# Patient Record
Sex: Male | Born: 1970 | Race: White | Hispanic: No | Smoking: Never smoker
Health system: Southern US, Community
[De-identification: ages and names within clinical notes are randomized; demographics above are authoritative.]

## PROBLEM LIST (undated history)

## (undated) DIAGNOSIS — I10 Essential (primary) hypertension: Secondary | ICD-10-CM

## (undated) DIAGNOSIS — K219 Gastro-esophageal reflux disease without esophagitis: Secondary | ICD-10-CM

## (undated) DIAGNOSIS — J45909 Unspecified asthma, uncomplicated: Secondary | ICD-10-CM

## (undated) DIAGNOSIS — I341 Nonrheumatic mitral (valve) prolapse: Secondary | ICD-10-CM

## (undated) DIAGNOSIS — R011 Cardiac murmur, unspecified: Secondary | ICD-10-CM

## (undated) HISTORY — DX: Cardiac murmur, unspecified: R01.1

## (undated) HISTORY — DX: Unspecified asthma, uncomplicated: J45.909

---

## 1999-10-03 HISTORY — PX: WISDOM TOOTH EXTRACTION: SHX21

## 2011-07-24 DIAGNOSIS — J309 Allergic rhinitis, unspecified: Secondary | ICD-10-CM | POA: Insufficient documentation

## 2016-03-21 DIAGNOSIS — I341 Nonrheumatic mitral (valve) prolapse: Secondary | ICD-10-CM | POA: Insufficient documentation

## 2016-06-28 DIAGNOSIS — N529 Male erectile dysfunction, unspecified: Secondary | ICD-10-CM | POA: Insufficient documentation

## 2017-03-07 DIAGNOSIS — K219 Gastro-esophageal reflux disease without esophagitis: Secondary | ICD-10-CM | POA: Insufficient documentation

## 2017-03-07 DIAGNOSIS — J4520 Mild intermittent asthma, uncomplicated: Secondary | ICD-10-CM | POA: Insufficient documentation

## 2018-04-05 DIAGNOSIS — Z800 Family history of malignant neoplasm of digestive organs: Secondary | ICD-10-CM | POA: Insufficient documentation

## 2018-10-27 ENCOUNTER — Encounter (HOSPITAL_BASED_OUTPATIENT_CLINIC_OR_DEPARTMENT_OTHER): Payer: Self-pay | Admitting: Emergency Medicine

## 2018-10-27 ENCOUNTER — Emergency Department (HOSPITAL_BASED_OUTPATIENT_CLINIC_OR_DEPARTMENT_OTHER): Payer: Managed Care, Other (non HMO)

## 2018-10-27 ENCOUNTER — Other Ambulatory Visit: Payer: Self-pay

## 2018-10-27 ENCOUNTER — Emergency Department (HOSPITAL_BASED_OUTPATIENT_CLINIC_OR_DEPARTMENT_OTHER)
Admission: EM | Admit: 2018-10-27 | Discharge: 2018-10-27 | Disposition: A | Discharge status: Home / Self Care | Payer: Managed Care, Other (non HMO) | Attending: Emergency Medicine | Admitting: Emergency Medicine

## 2018-10-27 DIAGNOSIS — Y999 Unspecified external cause status: Secondary | ICD-10-CM | POA: Insufficient documentation

## 2018-10-27 DIAGNOSIS — S6992XA Unspecified injury of left wrist, hand and finger(s), initial encounter: Secondary | ICD-10-CM | POA: Diagnosis present

## 2018-10-27 DIAGNOSIS — M25532 Pain in left wrist: Secondary | ICD-10-CM | POA: Diagnosis not present

## 2018-10-27 DIAGNOSIS — W208XXA Other cause of strike by thrown, projected or falling object, initial encounter: Secondary | ICD-10-CM | POA: Diagnosis not present

## 2018-10-27 DIAGNOSIS — Y939 Activity, unspecified: Secondary | ICD-10-CM | POA: Insufficient documentation

## 2018-10-27 DIAGNOSIS — Y9289 Other specified places as the place of occurrence of the external cause: Secondary | ICD-10-CM | POA: Insufficient documentation

## 2018-10-27 HISTORY — DX: Nonrheumatic mitral (valve) prolapse: I34.1

## 2018-10-27 HISTORY — DX: Gastro-esophageal reflux disease without esophagitis: K21.9

## 2018-10-27 MED ORDER — NAPROXEN 500 MG PO TABS: 500.0000 mg | ORAL_TABLET | Freq: Two times a day (BID) | ORAL | 0 refills | Status: DC

## 2018-10-27 NOTE — Discharge Instructions (Addendum)
Today you were evaluated for left wrist pain after an injury.  1. Medications: alternate naprosyn and tylenol for pain control, usual home medications 2. Treatment: rest, ice, elevate and use brace, drink plenty of fluids, gentle stretching 3. Follow Up: Please followup with orthopedics as directed or your PCP in 1 week if no improvement for discussion of your diagnoses and further evaluation after today's visit; if you do not have a primary care doctor use the resource guide provided to find one; Please return to the ER for worsening symptoms or other concerns.

## 2018-10-27 NOTE — ED Triage Notes (Signed)
Patient states that he hit his left fore arm last night when a ladder from the attic fell onto him

## 2018-10-27 NOTE — ED Provider Notes (Signed)
[facility] EMERGENCY DEPARTMENT Provider Note   CSN: [id number] Arrival date & time: 10/27/18  1017     History   Chief Complaint Chief Complaint  Patient presents with  . Wrist Pain    HPI Frederick Martin is a 48 y.o. male with a PMH of MVP presenting with left wrist pain after an injury last night. Patient states he hit his left forearm and wrist when a ladder from the attic fell on him. Patient states pain is worse with movement and better with tylenol, ice, and rest. Patient reports 2 small abrasions on left forearm and states his tetanus shot was less than 5 years ago. Patient reports edema and intermittent numbness/paresthesias. Patient reports mild weakness due to the pain. Patient describes pain as an ache and rates pain a 2/10 without medications. Patient denies any other acute complaints.   HPI  Past Medical History:  Diagnosis Date  . GERD (gastroesophageal reflux disease)   . MVP (mitral valve prolapse)     There are no active problems to display for this patient.   History reviewed. No pertinent surgical history.      Home Medications    Prior to Admission medications   Medication Sig Start Date End Date Taking? Authorizing Provider  naproxen (NAPROSYN) 500 MG tablet Take 1 tablet (500 mg total) by mouth 2 (two) times daily. 10/27/18   Arville Lime, PA-C    Family History History reviewed. No pertinent family history.  Social History Social History   Tobacco Use  . Smoking status: Never Smoker  . Smokeless tobacco: Never Used  Substance Use Topics  . Alcohol use: Never    Frequency: Never  . Drug use: Never     Allergies   Patient has no known allergies.   Review of Systems Review of Systems  Constitutional: Negative for chills, diaphoresis and fever.  Eyes: Negative for visual disturbance.  Respiratory: Negative for shortness of breath.   Cardiovascular: Negative for chest pain and palpitations.  Gastrointestinal: Negative  for abdominal pain, nausea and vomiting.  Endocrine: Negative for cold intolerance and heat intolerance.  Musculoskeletal: Positive for arthralgias and joint swelling. Negative for back pain, gait problem, myalgias, neck pain and neck stiffness.  Skin: Positive for wound. Negative for rash.  Allergic/Immunologic: Negative for immunocompromised state.  Neurological: Positive for weakness and numbness. Negative for dizziness, syncope and headaches.  Hematological: Negative for adenopathy.  Psychiatric/Behavioral: Negative for confusion.   Physical Exam Updated Vital Signs BP 131/78 (BP Location: Right Arm)   Pulse 69   Temp 98.1 F (36.7 C) (Oral)   Resp 18   Ht 5\' 11"  (1.803 m)   Wt 91.6 kg   SpO2 100%   BMI 28.17 kg/m   Physical Exam Vitals signs and nursing note reviewed.  Constitutional:      General: He is not in acute distress.    Appearance: He is well-developed. He is not diaphoretic.  HENT:     Head: Normocephalic.     Comments: Small abrasion noted over middle of forehead.  Eyes:     Extraocular Movements: Extraocular movements intact.     Conjunctiva/sclera: Conjunctivae normal.     Pupils: Pupils are equal, round, and reactive to light.  Neck:     Musculoskeletal: Normal range of motion and neck supple.  Cardiovascular:     Rate and Rhythm: Normal rate and regular rhythm.     Heart sounds: Normal heart sounds. No murmur. No friction rub. No gallop.  Pulmonary:     Effort: Pulmonary effort is normal. No respiratory distress.     Breath sounds: Normal breath sounds.  Abdominal:     Palpations: Abdomen is soft.     Tenderness: There is no abdominal tenderness.  Musculoskeletal:        General: Swelling and tenderness present. No deformity.     Left elbow: Normal. He exhibits normal range of motion, no swelling, no effusion and no deformity.     Right wrist: Normal. He exhibits normal range of motion, no tenderness, no bony tenderness and no swelling.     Left  wrist: He exhibits decreased range of motion, tenderness, bony tenderness, swelling (Mild edema noted over radial aspect of left wrist.) and laceration (2 small superficial abrasions noted over the dorsal aspect of left forearm. Bleeding controlled. ). He exhibits no crepitus and no deformity.     Right hand: Normal. He exhibits normal range of motion, no tenderness and no bony tenderness.     Left hand: He exhibits decreased range of motion, tenderness, bony tenderness and swelling. He exhibits normal capillary refill and no deformity. Normal sensation noted.     Comments: Left wrist reveals edema. Tenderness to palpation of radial aspect of left wrist. Scaphoid tenderness present. Full ROM of fingers except thumb. Decreased ROM of left thumb due to pain.  Decreased ROM of left wrist due to pain. Sensation intact. 2+ radial pulses. Decreased strength due to pain.   Skin:    General: Skin is warm.     Coloration: Skin is not pale.     Findings: No erythema or rash.  Neurological:     Mental Status: He is alert and oriented to person, place, and time.     ED Treatments / Results  Labs (all labs ordered are listed, but only abnormal results are displayed) Labs Reviewed - No data to display  EKG None  Radiology Dg Forearm Left  Result Date: 10/27/2018 CLINICAL DATA:  Left arm pain following an injury. EXAM: LEFT FOREARM - 2 VIEW COMPARISON:  None. FINDINGS: There is no evidence of fracture or other focal bone lesions. Soft tissues are unremarkable. IMPRESSION: Normal examination. Electronically Signed   By: Claudie Revering M.D.   On: 10/27/2018 11:07   Dg Wrist Complete Left  Result Date: 10/27/2018 CLINICAL DATA:  Fall from ladder EXAM: LEFT WRIST - COMPLETE 3+ VIEW COMPARISON:  none FINDINGS: There is no evidence of fracture or dislocation. There is no evidence of arthropathy or other focal bone abnormality. Soft tissues are unremarkable. IMPRESSION: Negative. Electronically Signed   By:  Kerby Moors M.D.   On: 10/27/2018 11:05    Procedures Procedures (including critical care time)  Medications Ordered in ED Medications - No data to display   Initial Impression / Assessment and Plan / ED Course  I have reviewed the triage vital signs and the nursing notes.  Pertinent labs & imaging results that were available during my care of the patient were reviewed by me and considered in my medical decision making (see chart for details).  Clinical Course as of Oct 28 1219  Sun Oct 27, 2018  1141 There is no evidence of fracture or other focal bone lesions. Soft tissues are unremarkable.    DG Forearm Left [AH]  1141 There is no evidence of fracture or dislocation.  DG Wrist Complete Left [AH]    Clinical Course User Index [AH] Arville Lime, PA-C   Patient X-Ray negative for obvious fracture  or dislocation. Pain managed in ED without medications per patient's request. Pt advised to follow up with orthopedics if symptoms persist for possibility of missed fracture diagnosis. Patient given brace while in ED, conservative therapy recommended and discussed. Encouraged patient to follow up with orthopedics due to scaphoid tenderness. Patient will be dc home & is agreeable with above plan.  Final Clinical Impressions(s) / ED Diagnoses   Final diagnoses:  Left wrist pain    ED Discharge Orders         Ordered    naproxen (NAPROSYN) 500 MG tablet  2 times daily     10/27/18 1218           Darlin Drop [place], [place] 10/27/18 1221    Virgel Manifold, MD 10/28/18 1205

## 2019-04-22 NOTE — Progress Notes (Signed)
Subjective:    Frederick Martin is a 48 y.o. male who presents today for his Complete Annual Exam. New patient. Dad with colon cancer, Dx in 72s. Needs screening. Wife and 3 stepdaughters are patients at [facility]. Snores. Uses sleep app. ETOH - beer or wine 3x/week, 1-2 drinks. Rare THC. Hx of ED, inability to maintain an erection. Intercourse 2-5 times per week, newlywed. Has seen Urology in the past. Tried sildenafil. Doesn't think testosterone has ever been checked.   Current Outpatient Medications:  .  albuterol (VENTOLIN HFA) 108 (90 Base) MCG/ACT inhaler, INHALE TWO PUFFS BY MOUTH EVERY 4 TO 6 HOURS AS NEEDED FOR WHEEZING AND FOR SHORTNESS OF BREATH *SCHEDULE APPOINTMENT BEFORE NEXT REFILL*, Disp: , Rfl:  .  naproxen (NAPROSYN) 500 MG tablet, Take 1 tablet (500 mg total) by mouth 2 (two) times daily., Disp: 30 tablet, Rfl: 0 .  sildenafil (REVATIO) 20 MG tablet, Take 1 to 5 tablets as needed, Disp: , Rfl:  .  pantoprazole (PROTONIX) 40 MG tablet, TAKE ONE TABLET BY MOUTH ONCE DAILY, Disp: , Rfl:   There are no preventive care reminders to display for this patient.  PMHx, SurgHx, SocialHx, Medications, and Allergies were reviewed in the Visit Navigator and updated as appropriate.   Past Medical History:  Diagnosis Date  . Asthma   . GERD (gastroesophageal reflux disease)   . Heart murmur   . MVP (mitral valve prolapse)    Past Surgical History:  Procedure Laterality Date  . WISDOM TOOTH EXTRACTION  2001   Family History  Problem Relation Age of Onset  . Depression Mother   . Depression Father   . Cancer Father   . Diabetes Father   . Heart disease Father   . Hyperlipidemia Father   . Hypertension Father   . Cancer Maternal Grandmother   . Cancer Paternal Grandfather    Social History   Tobacco Use  . Smoking status: Never Smoker  . Smokeless tobacco: Never Used  Substance Use Topics  . Alcohol use: Yes    Frequency: Never  . Drug use: Never   Review of Systems:    Pertinent items are noted in the HPI. Otherwise, ROS is negative.  Objective:   Vitals:   04/23/19 0753  BP: 124/90  Pulse: 70  Temp: 98.7 F (37.1 C)  SpO2: 97%   Body mass index is 28.45 kg/m.  General Appearance:  Alert, cooperative, no distress, appears stated age  Head:  Normocephalic, without obvious abnormality, atraumatic  Eyes:  PERRL, conjunctiva/corneas clear, EOM's intact, fundi benign, both eyes       Ears:  Normal TM's and external ear canals, both ears  Nose: Nares normal, septum midline, mucosa normal, no drainage    or sinus tenderness  Throat: Lips, mucosa, and tongue normal; teeth and gums normal  Neck: Supple, symmetrical, trachea midline, no adenopathy; thyroid:  No enlargement/tenderness/nodules; no carotit bruit or JVD  Back:   Symmetric, no curvature, ROM normal, no CVA tenderness  Lungs:   Clear to auscultation bilaterally, respirations unlabored  Chest wall:  No tenderness or deformity  Heart:  Regular rate and rhythm, S1 and S2 normal, no murmur, rub   or gallop  Abdomen:   Soft, non-tender, bowel sounds active all four quadrants, no masses, no organomegaly  Extremities: Extremities normal, atraumatic, no cyanosis or edema  Prostate: Not done.   Skin: Skin color, texture, turgor normal, no rashes or lesions  Lymph nodes: Cervical, supraclavicular, and axillary nodes normal  Neurologic: CNII-XII grossly intact. Normal strength, sensation and reflexes throughout   Assessment/Plan:   Frederick Martin was seen today for establish care.  Diagnoses and all orders for this visit:  Gastroesophageal reflux disease, esophagitis presence not specified -     Cancel: Testosterone, Free & Total-Male [facility] -     Ambulatory referral to Gastroenterology  Heart murmur -     Cancel: Testosterone, Free & Total-Male [facility] -     ECHOCARDIOGRAM COMPLETE; Future  Mild asthma, unspecified whether complicated, unspecified whether persistent -     Cancel: Testosterone,  Free & Total-Male [facility]  MVP (mitral valve prolapse) -     Cancel: Testosterone, Free & Total-Male [facility]  Erectile dysfunction, unspecified erectile dysfunction type -     CBC with Differential/Platelet -     Comprehensive metabolic panel -     TSH -     Cancel: Testosterone, Free & Total-Male [facility] -     PSA -     Testos,Total,Free and SHBG (Male) -     Iron, TIBC and Ferritin Panel  Screening for HIV (human immunodeficiency virus) -     HIV Antibody (routine testing w rflx)  Family history of colon cancer in father -     Ambulatory referral to Gastroenterology  Screening for lipid disorders -     Lipid panel  Other orders -     Cancel: Ferritin    Patient Counseling: [x]   Nutrition: Stressed importance of moderation in sodium/caffeine intake, saturated fat and cholesterol, caloric balance, sufficient intake of fresh fruits, vegetables, and fiber.  [x]   Stressed the importance of regular exercise.   []   Substance Abuse: Discussed cessation/primary prevention of tobacco, alcohol, or other drug use; driving or other dangerous activities under the influence; availability of treatment for abuse.   [x]   Injury prevention: Discussed safety belts, safety helmets, smoke detector, smoking near bedding or upholstery.   []   Sexuality: Discussed sexually transmitted diseases, partner selection, use of condoms, avoidance of unintended pregnancy and contraceptive alternatives.   [x]   Dental health: Discussed importance of regular tooth brushing, flossing, and dental visits.  [x]   Health maintenance and immunizations reviewed. Please refer to Health maintenance section.    [name], DO [place]

## 2019-04-23 ENCOUNTER — Encounter: Payer: Self-pay | Admitting: Family Medicine

## 2019-04-23 ENCOUNTER — Ambulatory Visit (INDEPENDENT_AMBULATORY_CARE_PROVIDER_SITE_OTHER): Payer: Managed Care, Other (non HMO) | Admitting: Family Medicine

## 2019-04-23 ENCOUNTER — Other Ambulatory Visit: Payer: Self-pay

## 2019-04-23 VITALS — BP 124/90 | HR 70 | Temp 98.7°F | Ht 71.0 in | Wt 204.0 lb

## 2019-04-23 DIAGNOSIS — K219 Gastro-esophageal reflux disease without esophagitis: Secondary | ICD-10-CM

## 2019-04-23 DIAGNOSIS — R011 Cardiac murmur, unspecified: Secondary | ICD-10-CM | POA: Diagnosis not present

## 2019-04-23 DIAGNOSIS — I341 Nonrheumatic mitral (valve) prolapse: Secondary | ICD-10-CM

## 2019-04-23 DIAGNOSIS — Z0000 Encounter for general adult medical examination without abnormal findings: Secondary | ICD-10-CM | POA: Diagnosis not present

## 2019-04-23 DIAGNOSIS — N529 Male erectile dysfunction, unspecified: Secondary | ICD-10-CM | POA: Diagnosis not present

## 2019-04-23 DIAGNOSIS — Z13220 Encounter for screening for lipoid disorders: Secondary | ICD-10-CM

## 2019-04-23 DIAGNOSIS — J45909 Unspecified asthma, uncomplicated: Secondary | ICD-10-CM | POA: Diagnosis not present

## 2019-04-23 DIAGNOSIS — Z114 Encounter for screening for human immunodeficiency virus [HIV]: Secondary | ICD-10-CM

## 2019-04-23 DIAGNOSIS — Z800 Family history of malignant neoplasm of digestive organs: Secondary | ICD-10-CM

## 2019-04-23 LAB — LIPID PANEL
Cholesterol: 154 mg/dL (ref 0–200)
HDL: 44.7 mg/dL (ref >39.00)
LDL Cholesterol: 84 mg/dL (ref 0–99)
NonHDL: 109.3
Total CHOL/HDL Ratio: 3
Triglycerides: 127 mg/dL (ref 0.0–149.0)
VLDL: 25.4 mg/dL (ref 0.0–40.0)

## 2019-04-23 LAB — CBC WITH DIFFERENTIAL/PLATELET
Basophils Absolute: 0.1 10*3/uL (ref 0.0–0.1)
Basophils Relative: 1.2 % (ref 0.0–3.0)
Eosinophils Absolute: 0.8 10*3/uL — ABNORMAL HIGH (ref 0.0–0.7)
Eosinophils Relative: 11.8 % — ABNORMAL HIGH (ref 0.0–5.0)
HCT: 47.6 % (ref 39.0–52.0)
Hemoglobin: 15.9 g/dL (ref 13.0–17.0)
Lymphocytes Relative: 19.9 % (ref 12.0–46.0)
Lymphs Abs: 1.4 10*3/uL (ref 0.7–4.0)
MCHC: 33.4 g/dL (ref 30.0–36.0)
MCV: 89.2 fl (ref 78.0–100.0)
Monocytes Absolute: 0.7 10*3/uL (ref 0.1–1.0)
Monocytes Relative: 9.9 % (ref 3.0–12.0)
Neutro Abs: 3.9 10*3/uL (ref 1.4–7.7)
Neutrophils Relative %: 57.2 % (ref 43.0–77.0)
Platelets: 262 10*3/uL (ref 150.0–400.0)
RBC: 5.34 Mil/uL (ref 4.22–5.81)
RDW: 12.9 % (ref 11.5–15.5)
WBC: 6.8 10*3/uL (ref 4.0–10.5)

## 2019-04-23 LAB — COMPREHENSIVE METABOLIC PANEL
ALT: 24 U/L (ref 0–53)
AST: 17 U/L (ref 0–37)
Albumin: 4.5 g/dL (ref 3.5–5.2)
Alkaline Phosphatase: 30 U/L — ABNORMAL LOW (ref 39–117)
BUN: 9 mg/dL (ref 6–23)
CO2: 31 mEq/L (ref 19–32)
Calcium: 9.6 mg/dL (ref 8.4–10.5)
Chloride: 102 mEq/L (ref 96–112)
Creatinine, Ser: 0.9 mg/dL (ref 0.40–1.50)
GFR: 90.79 mL/min (ref >60.00)
Glucose, Bld: 91 mg/dL (ref 70–99)
Potassium: 4.8 mEq/L (ref 3.5–5.1)
Sodium: 139 mEq/L (ref 135–145)
Total Bilirubin: 0.7 mg/dL (ref 0.2–1.2)
Total Protein: 6.8 g/dL (ref 6.0–8.3)

## 2019-04-23 LAB — PSA: PSA: 1.25 ng/mL (ref 0.10–4.00)

## 2019-04-23 LAB — TSH: TSH: 1.68 u[IU]/mL (ref 0.35–4.50)

## 2019-04-23 NOTE — Patient Instructions (Addendum)
Your insurance doesn't seem to pay for heartburn medications. I'd like for you to try two OTC medications:  Nexium 20 mg 1-2 times daily. Pepcid AC daily.  Okay to do both.

## 2019-04-24 ENCOUNTER — Telehealth: Payer: Self-pay | Admitting: Gastroenterology

## 2019-04-24 ENCOUNTER — Encounter: Payer: Self-pay | Admitting: Gastroenterology

## 2019-04-24 LAB — HIV ANTIBODY (ROUTINE TESTING W REFLEX): HIV 1&2 Ab, 4th Generation: NONREACTIVE

## 2019-04-24 LAB — IRON,TIBC AND FERRITIN PANEL
%SAT: 38 % (calc) (ref 20–48)
Ferritin: 86 ng/mL (ref 38–380)
Iron: 138 ug/dL (ref 50–180)
TIBC: 361 mcg/dL (calc) (ref 250–425)

## 2019-04-24 NOTE — Telephone Encounter (Signed)
Hi Dr. Tarri Glenn, we have received a referral from patient's PCP for an endo colon due to fam hx of colon cancer. Patients' father was diagnosed with colon cancer in his 83s. He also has a longstanding hx of uncontrolled gerd. He was seen in August of 2019 at [facility], records are in [place]. Could you please review his records and advise on scheduling? Thank you.

## 2019-04-24 NOTE — Telephone Encounter (Signed)
Reviewed. That is fine. He should have consultation first. Could be a virtual visit.

## 2019-04-24 NOTE — Telephone Encounter (Signed)
Consult scheduled on 8/25 at 2:30pm.

## 2019-04-28 ENCOUNTER — Encounter: Payer: Self-pay | Admitting: Family Medicine

## 2019-04-28 DIAGNOSIS — B019 Varicella without complication: Secondary | ICD-10-CM | POA: Insufficient documentation

## 2019-04-28 LAB — TESTOS,TOTAL,FREE AND SHBG (FEMALE)
Free Testosterone: 69.5 pg/mL (ref 35.0–155.0)
Sex Hormone Binding: 13 nmol/L (ref 10–50)
Testosterone, Total, LC-MS-MS: 286 ng/dL (ref 250–1100)

## 2019-05-02 ENCOUNTER — Ambulatory Visit (HOSPITAL_COMMUNITY): Payer: Managed Care, Other (non HMO) | Attending: Internal Medicine

## 2019-05-02 ENCOUNTER — Other Ambulatory Visit: Payer: Self-pay

## 2019-05-02 ENCOUNTER — Encounter: Payer: Self-pay | Admitting: Family Medicine

## 2019-05-02 DIAGNOSIS — R011 Cardiac murmur, unspecified: Secondary | ICD-10-CM | POA: Diagnosis present

## 2019-05-02 DIAGNOSIS — R7989 Other specified abnormal findings of blood chemistry: Secondary | ICD-10-CM

## 2019-05-05 ENCOUNTER — Encounter: Payer: Self-pay | Admitting: Family Medicine

## 2019-05-05 ENCOUNTER — Other Ambulatory Visit: Payer: Self-pay | Admitting: Family Medicine

## 2019-05-05 DIAGNOSIS — N529 Male erectile dysfunction, unspecified: Secondary | ICD-10-CM

## 2019-05-05 MED ORDER — SILDENAFIL CITRATE 20 MG PO TABS: ORAL_TABLET | ORAL | 2 refills | Status: DC

## 2019-05-05 NOTE — Telephone Encounter (Signed)
Pt request refill  sildenafil (REVATIO) 20 MG tablet  He states Dr Juleen China has never filled this Rx for him because he is new to her  [place], [place] [phone] (Phone) [phone] (Fax)

## 2019-05-08 ENCOUNTER — Other Ambulatory Visit: Payer: Self-pay

## 2019-05-08 DIAGNOSIS — R011 Cardiac murmur, unspecified: Secondary | ICD-10-CM

## 2019-05-22 ENCOUNTER — Other Ambulatory Visit: Payer: Self-pay

## 2019-05-22 ENCOUNTER — Other Ambulatory Visit (INDEPENDENT_AMBULATORY_CARE_PROVIDER_SITE_OTHER): Payer: Managed Care, Other (non HMO)

## 2019-05-22 DIAGNOSIS — R7989 Other specified abnormal findings of blood chemistry: Secondary | ICD-10-CM

## 2019-05-27 ENCOUNTER — Encounter: Payer: Self-pay | Admitting: Family Medicine

## 2019-05-27 ENCOUNTER — Ambulatory Visit (INDEPENDENT_AMBULATORY_CARE_PROVIDER_SITE_OTHER): Payer: Managed Care, Other (non HMO) | Admitting: Gastroenterology

## 2019-05-27 ENCOUNTER — Encounter: Payer: Self-pay | Admitting: Gastroenterology

## 2019-05-27 VITALS — BP 152/90 | HR 72 | Temp 98.2°F | Ht 71.0 in | Wt 204.0 lb

## 2019-05-27 DIAGNOSIS — R1310 Dysphagia, unspecified: Secondary | ICD-10-CM | POA: Diagnosis not present

## 2019-05-27 DIAGNOSIS — K219 Gastro-esophageal reflux disease without esophagitis: Secondary | ICD-10-CM

## 2019-05-27 DIAGNOSIS — Z800 Family history of malignant neoplasm of digestive organs: Secondary | ICD-10-CM

## 2019-05-27 DIAGNOSIS — N529 Male erectile dysfunction, unspecified: Secondary | ICD-10-CM

## 2019-05-27 MED ORDER — PANTOPRAZOLE SODIUM 40 MG PO TBEC: 40.0000 mg | DELAYED_RELEASE_TABLET | Freq: Every day | ORAL | 3 refills | Status: DC

## 2019-05-27 MED ORDER — NA SULFATE-K SULFATE-MG SULF 17.5-3.13-1.6 GM/177ML PO SOLN: 1.0000 | ORAL | 0 refills | Status: AC

## 2019-05-27 NOTE — Telephone Encounter (Signed)
Last fill 05/05/19  #10/2 Last OV 04/23/19 Patient requesting more per quantity.

## 2019-05-27 NOTE — Patient Instructions (Addendum)
I have recommended a colonoscopy and upper endoscopy with possible dilation and biopsies.  We will work to get your pantoprazole 40 mg daily refilled.   Tips for colonoscopy:  - Stay well hydrated for 3-4 days prior to the exam. This reduces nausea and dehydration.  - To prevent skin/hemorrhoid irritation - prior to wiping, put A&Dointment or vaseline on the toilet paper. - Keep a towel or pad on the bed.  - Drink  64oz of clear liquids in the morning of prep day (prior to starting the prep) to be sure that there is enough fluid to flush the colon and stay hydrated!!!! This is in addition to the fluids required for preparation. - Use of a flavored hard candy, such as grape Anise Salvo, can counteract some of the flavor of the prep and may prevent some nausea.    Thank you for choosing [facility] for your health care needs! [place], MD

## 2019-05-27 NOTE — Progress Notes (Addendum)
Referring Provider: [name], DO Primary Care Physician:  [name], DO  Reason for Consultation: Family history of colon cancer   IMPRESSION:  Recurrent solid food dysphagia deviously requiring esophageal dilation Asthma - rarely needing an inhaler No prior colon cancer screening Family history of colon cancer (father in his 74s, paternal grandfather in his 28s)   Recurrent solid food dysphasia.  EGD recommended for evaluation and possible balloon dilation.  Taking into account the longstanding nature of his symptoms which is previously required esophageal dilation, evaluation for eosinophilic esophagitis is appropriate.  Continue PPI therapy at this time.  See if we can get pantoprazole approved as this provided significantly more relief for him in the past.  He is due colon cancer screening given his family history of colon cancer.     PLAN: Pantoprazole 40 mg daily EGD with possible dilation and esophageal biopsies Colonoscopy Obtain EGD report from [place] approximately 10 years  I consented the patient at the bedside today discussing the risks, benefits, and alternatives to endoscopic evaluation. In particular, we discussed the risks that include, but are not limited to, reaction to medication, cardiopulmonary compromise, bleeding requiring blood transfusion, aspiration resulting in pneumonia, perforation requiring surgery, lack of diagnosis, severe illness requiring hospitalization, and even death. We reviewed the risk of missed lesion including polyps or even cancer. The patient acknowledges these risks and asks that we proceed.  Please see the "Patient Instructions" section for addition details about the plan.  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  He has a history of reflux and dysphasia and mitral valve prolapse.  He had solid food dysphagia with transient food impaction 10 years ago and underwent EGD with dilation with  improvement of his symptoms.  No childhood food allergies but he does have a history of asthma.  No seasonal allergies.   He has a history of reflux. Previously controlled on pantoprazole. Had to switch to [facility] generic which didn't work as well. He is currently using Nexium OTC.   Some recurrent intermittent dysphagia primarily with solids such as steak. Lodges at the sternal notch. Passes with sips or water and coughing.  No odynophagia, dysphonia, sore throat, neck pain, cough.  No change in bowel habits, anorexia, or weight loss.  No other associated symptoms. No identified exacerbating or relieving features.   Previously evaluated by Dr. Shary Key.  EGD and colonoscopy were recommended but not performed.  Deferred his procedures to a new calendar year due to his high deductible insurance plan.  His father had colon cancer diagnosed in his 71s. Paternal grandfather with colon cancer in his 105s.   No other known family history of colon cancer or polyps. No family history of uterine/endometrial cancer, pancreatic cancer or gastric/stomach cancer.   Past Medical History:  Diagnosis Date  . Asthma   . GERD (gastroesophageal reflux disease)   . Heart murmur   . MVP (mitral valve prolapse)     Past Surgical History:  Procedure Laterality Date  . WISDOM TOOTH EXTRACTION  2001    Current Outpatient Medications  Medication Sig Dispense Refill  . albuterol (VENTOLIN HFA) 108 (90 Base) MCG/ACT inhaler INHALE TWO PUFFS BY MOUTH EVERY 4 TO 6 HOURS AS NEEDED FOR WHEEZING AND FOR SHORTNESS OF BREATH *SCHEDULE APPOINTMENT BEFORE NEXT REFILL*    . naproxen (NAPROSYN) 500 MG tablet Take 1 tablet (500 mg total) by mouth 2 (two) times daily. 30 tablet 0  . pantoprazole (PROTONIX)  40 MG tablet TAKE ONE TABLET BY MOUTH ONCE DAILY    . sildenafil (REVATIO) 20 MG tablet Take 1 to 5 tablets as needed 10 tablet 2   No current facility-administered medications for this visit.     Allergies as of  05/27/2019  . (No Known Allergies)    Family History  Problem Relation Age of Onset  . Depression Mother   . Depression Father   . Cancer Father   . Diabetes Father   . Heart disease Father   . Hyperlipidemia Father   . Hypertension Father   . Cancer Maternal Grandmother   . Cancer Paternal Grandfather     Social History   Socioeconomic History  . Marital status: Married    Spouse name: Not on file  . Number of children: Not on file  . Years of education: Not on file  . Highest education level: Not on file  Occupational History  . Not on file  Social Needs  . Financial resource strain: Not on file  . Food insecurity    Worry: Not on file    Inability: Not on file  . Transportation needs    Medical: Not on file    Non-medical: Not on file  Tobacco Use  . Smoking status: Never Smoker  . Smokeless tobacco: Never Used  Substance and Sexual Activity  . Alcohol use: Yes    Frequency: Never  . Drug use: Never  . Sexual activity: Yes  Lifestyle  . Physical activity    Days per week: Not on file    Minutes per session: Not on file  . Stress: Not on file  Relationships  . Social Herbalist on phone: Not on file    Gets together: Not on file    Attends religious service: Not on file    Active member of club or organization: Not on file    Attends meetings of clubs or organizations: Not on file    Relationship status: Not on file  . Intimate partner violence    Fear of current or ex partner: Not on file    Emotionally abused: Not on file    Physically abused: Not on file    Forced sexual activity: Not on file  Other Topics Concern  . Not on file  Social History Narrative  . Not on file    Review of Systems: 12 system ROS is negative except as noted above allergies and insomnia.   Physical Exam: General:   Alert,  well-nourished, pleasant and cooperative in NAD Head:  Normocephalic and atraumatic. Eyes:  Sclera clear, no icterus.   Conjunctiva  pink. Ears:  Normal auditory acuity. Nose:  No deformity, discharge,  or lesions. Mouth:  No deformity or lesions.   Neck:  Supple; no masses or thyromegaly. Lungs:  Clear throughout to auscultation.   No wheezes. Heart:  Regular rate and rhythm; no murmurs. Abdomen:  Soft,nontender, nondistended, normal bowel sounds, no rebound or guarding. No hepatosplenomegaly.   Rectal:  Deferred  Msk:  Symmetrical. No boney deformities LAD: No inguinal or umbilical LAD Extremities:  No clubbing or edema. Neurologic:  Alert and  oriented x4;  grossly nonfocal Skin:  Intact without significant lesions or rashes. Psych:  Alert and cooperative. Normal mood and affect.    Tashina Credit L. Tarri Glenn, MD, MPH 05/27/2019, 10:12 AM

## 2019-05-28 LAB — TESTOS,TOTAL,FREE AND SHBG (FEMALE)
Free Testosterone: 79.6 pg/mL (ref 35.0–155.0)
Sex Hormone Binding: 16 nmol/L (ref 10–50)
Testosterone, Total, LC-MS-MS: 357 ng/dL (ref 250–1100)

## 2019-05-28 MED ORDER — SILDENAFIL CITRATE 20 MG PO TABS: ORAL_TABLET | ORAL | 2 refills | Status: AC

## 2019-06-06 ENCOUNTER — Encounter: Payer: Self-pay | Admitting: Family Medicine

## 2019-06-06 DIAGNOSIS — R7989 Other specified abnormal findings of blood chemistry: Secondary | ICD-10-CM

## 2019-06-06 DIAGNOSIS — N529 Male erectile dysfunction, unspecified: Secondary | ICD-10-CM

## 2019-06-24 ENCOUNTER — Encounter: Payer: Self-pay | Admitting: Gastroenterology

## 2019-06-27 ENCOUNTER — Telehealth: Payer: Self-pay | Admitting: Gastroenterology

## 2019-06-27 NOTE — Telephone Encounter (Signed)
Covid-19 screening questions  Covid-19 screening questions  Do you now or have you had a fever in the last 14 days? no  Do you have any respiratory symptoms of shortness of breath or cough now or in the last 14 days? no  Do you have any family members or close contacts with diagnosed or suspected Covid-19 in the past 14 days? no  Have you been tested for Covid-19 and found to be positive? no

## 2019-06-27 NOTE — Telephone Encounter (Signed)
Pt is scheduled for an egd with Dr. Tarri Glenn next Monday. He states that last time that he had an egd he experienced spams after procedure and they were very painful so he wants to know if Dr. Tarri Glenn could prescribe something beforehand.

## 2019-06-27 NOTE — Telephone Encounter (Signed)
I am sorry that he has had trouble in the past with prior dilations. I am not sure what I could prescribe beforehand that would prevent that. Thank you.

## 2019-06-30 ENCOUNTER — Ambulatory Visit (AMBULATORY_SURGERY_CENTER): Payer: Managed Care, Other (non HMO) | Admitting: Gastroenterology

## 2019-06-30 ENCOUNTER — Other Ambulatory Visit: Payer: Self-pay | Admitting: Gastroenterology

## 2019-06-30 ENCOUNTER — Other Ambulatory Visit: Payer: Self-pay

## 2019-06-30 ENCOUNTER — Encounter: Payer: Self-pay | Admitting: Gastroenterology

## 2019-06-30 VITALS — BP 125/77 | HR 70 | Temp 98.4°F | Resp 15 | Ht 71.0 in | Wt 204.0 lb

## 2019-06-30 DIAGNOSIS — D120 Benign neoplasm of cecum: Secondary | ICD-10-CM

## 2019-06-30 DIAGNOSIS — K222 Esophageal obstruction: Secondary | ICD-10-CM

## 2019-06-30 DIAGNOSIS — R1310 Dysphagia, unspecified: Secondary | ICD-10-CM | POA: Diagnosis not present

## 2019-06-30 DIAGNOSIS — K219 Gastro-esophageal reflux disease without esophagitis: Secondary | ICD-10-CM

## 2019-06-30 DIAGNOSIS — K635 Polyp of colon: Secondary | ICD-10-CM

## 2019-06-30 DIAGNOSIS — Z800 Family history of malignant neoplasm of digestive organs: Secondary | ICD-10-CM | POA: Diagnosis not present

## 2019-06-30 DIAGNOSIS — K317 Polyp of stomach and duodenum: Secondary | ICD-10-CM | POA: Diagnosis not present

## 2019-06-30 DIAGNOSIS — K200 Eosinophilic esophagitis: Secondary | ICD-10-CM

## 2019-06-30 DIAGNOSIS — Z1211 Encounter for screening for malignant neoplasm of colon: Secondary | ICD-10-CM | POA: Diagnosis present

## 2019-06-30 MED ORDER — SODIUM CHLORIDE 0.9 % IV SOLN: 500.0000 mL | Freq: Once | INTRAVENOUS | Status: DC

## 2019-06-30 NOTE — Progress Notes (Signed)
Called to room to assist during endoscopic procedure.  Patient ID and intended procedure confirmed with present staff. Received instructions for my participation in the procedure from the performing physician.  

## 2019-06-30 NOTE — Progress Notes (Signed)
Report given to PACU, vss 

## 2019-06-30 NOTE — Op Note (Signed)
[facility] Patient Name: Frederick Martin Procedure Date: 06/30/2019 3:16 PM MRN: [id number] Endoscopist: Thornton Park MD, MD Age: 48 Referring MD:  Date of Birth: Aug 26, 1971 Gender: Male Account #: [id number] Procedure:                Upper GI endoscopy Indications:              Recurrent solid food dysphagia deviously requiring                            esophageal dilation                           Asthma - rarely needing an inhaler Medicines:                See the Anesthesia note for documentation of the                            administered medications Procedure:                Pre-Anesthesia Assessment:                           - Prior to the procedure, a History and Physical                            was performed, and patient medications and                            allergies were reviewed. The patient's tolerance of                            previous anesthesia was also reviewed. The risks                            and benefits of the procedure and the sedation                            options and risks were discussed with the patient.                            All questions were answered, and informed consent                            was obtained. Prior Anticoagulants: The patient has                            taken no previous anticoagulant or antiplatelet                            agents. ASA Grade Assessment: II - A patient with                            mild systemic disease. After reviewing the risks  and benefits, the patient was deemed in                            satisfactory condition to undergo the procedure.                           After obtaining informed consent, the endoscope was                            passed under direct vision. Throughout the                            procedure, the patient's blood pressure, pulse, and                            oxygen saturations were monitored continuously. The                             Endoscope was introduced through the mouth, and                            advanced to the third part of duodenum. The upper                            GI endoscopy was accomplished without difficulty.                            The patient tolerated the procedure well. Scope In: Scope Out: Findings:                 A moderate Schatzki ring was found at the lower                            esophageal sphincter. A TTS dilator was passed                            through the scope. Dilation with a 16-17-18 mm                            balloon dilator was performed to 30mm with no                            resistance. There was resistance at 18 mm and the                            balloon remained inflated for 60 seconds. A rent                            was formed with dilation. Estimated blood loss was                            minimal.  The examined esophagus otherwise was normal.                            Biopsies were taken from the distal esophagus as                            well as from the mid and proximal esophagus with a                            cold forceps for histology.                           Diffuse mildly erythematous mucosa without bleeding                            was found in the gastric body. Biopsies were taken                            from the antrum, body, and fundus with a cold                            forceps for histology. Estimated blood loss was                            minimal.                           Multiple small sessile polyps with no bleeding and                            no stigmata of recent bleeding were found in the                            gastric body. These appear to be fundic gland                            polyps. Biopsies were taken with a cold forceps for                            histology. Estimated blood loss was minimal.                           Diffuse mildly  erythematous mucosa without active                            bleeding was found in the duodenal bulb. Biopsies                            were taken with a cold forceps for histology. Complications:            No immediate complications. Estimated Blood Loss:     Estimated blood loss was minimal. Impression:               - Moderate Schatzki ring. Dilated.                           -  Normal esophagus. Biopsied.                           - Erythematous mucosa in the gastric body. Biopsied.                           - Multiple gastric polyps. Biopsied.                           - Erythematous duodenopathy. Biopsied. Recommendation:           - Patient has a contact number available for                            emergencies. The signs and symptoms of potential                            delayed complications were discussed with the                            patient. Return to normal activities tomorrow.                            Written discharge instructions were provided to the                            patient.                           - Clear liquid diet today.                           - Continue present medications. Continue                            pantoprazole 40 mg daily.                           - Await pathology results. Thornton Park MD, MD 06/30/2019 4:04:22 PM This report has been signed electronically.

## 2019-06-30 NOTE — Patient Instructions (Signed)
Impression/Recommendations:  Clear liquid diet today.  Diverticulosis handout given to patient. Polyp handout given to patient.  Continue present medications. Continue pantoprazole 40 mg. Daily. Await pathology results.  Repeat colonoscopy in 5 years for surveillance.  YOU HAD AN ENDOSCOPIC PROCEDURE TODAY AT [facility]:   Refer to the procedure report that was given to you for any specific questions about what was found during the examination.  If the procedure report does not answer your questions, please call your gastroenterologist to clarify.  If you requested that your care partner not be given the details of your procedure findings, then the procedure report has been included in a sealed envelope for you to review at your convenience later.  YOU SHOULD EXPECT: Some feelings of bloating in the abdomen. Passage of more gas than usual.  Walking can help get rid of the air that was put into your GI tract during the procedure and reduce the bloating. If you had a lower endoscopy (such as a colonoscopy or flexible sigmoidoscopy) you may notice spotting of blood in your stool or on the toilet paper. If you underwent a bowel prep for your procedure, you may not have a normal bowel movement for a few days.  Please Note:  You might notice some irritation and congestion in your nose or some drainage.  This is from the oxygen used during your procedure.  There is no need for concern and it should clear up in a day or so.  SYMPTOMS TO REPORT IMMEDIATELY:   Following lower endoscopy (colonoscopy or flexible sigmoidoscopy):  Excessive amounts of blood in the stool  Significant tenderness or worsening of abdominal pains  Swelling of the abdomen that is new, acute  Fever of 100F or higher   Following upper endoscopy (EGD)  Vomiting of blood or coffee ground material  New chest pain or pain under the shoulder blades  Painful or persistently difficult swallowing  New shortness  of breath  Fever of 100F or higher  Black, tarry-looking stools  For urgent or emergent issues, a gastroenterologist can be reached at any hour by calling [phone].   DIET:  We do recommend a small meal at first, but then you may proceed to your regular diet.  Drink plenty of fluids but you should avoid alcoholic beverages for 24 hours.  ACTIVITY:  You should plan to take it easy for the rest of today and you should NOT DRIVE or use heavy machinery until tomorrow (because of the sedation medicines used during the test).    FOLLOW UP: Our staff will call the number listed on your records 48-72 hours following your procedure to check on you and address any questions or concerns that you may have regarding the information given to you following your procedure. If we do not reach you, we will leave a message.  We will attempt to reach you two times.  During this call, we will ask if you have developed any symptoms of COVID 19. If you develop any symptoms (ie: fever, flu-like symptoms, shortness of breath, cough etc.) before then, please call [phone].  If you test positive for Covid 19 in the 2 weeks post procedure, please call and report this information to [place].    If any biopsies were taken you will be contacted by phone or by letter within the next 1-3 weeks.  Please call us at [phone] if you have not heard about the biopsies in 3 weeks.    SIGNATURES/CONFIDENTIALITY: You  and/or your care partner have signed paperwork which will be entered into your electronic medical record.  These signatures attest to the fact that that the information above on your After Visit Summary has been reviewed and is understood.  Full responsibility of the confidentiality of this discharge information lies with you and/or your care-partner.

## 2019-06-30 NOTE — Progress Notes (Signed)
Temp check by KA/vital check by CW.  Reviewed medical and surgical history with the patient.

## 2019-06-30 NOTE — Telephone Encounter (Signed)
Spoke with patient and informed him that the nurses will call him after the procedure to see if he has any complications after the procedure.

## 2019-06-30 NOTE — Op Note (Signed)
[facility] Patient Name: Frederick Martin Procedure Date: 06/30/2019 3:15 PM MRN: [id number] Endoscopist: Thornton Park MD, MD Age: 48 Referring MD:  Date of Birth: 1971-07-01 Gender: Male Account #: [id number] Procedure:                Colonoscopy Indications:              Screening in patient at increased risk: Family                            history of 1st-degree relative with colorectal                            cancer before age 30 years                           No prior colon cancer screening                           Family history of colon cancer (father in his 71s,                            paternal grandfather in his 59s) Medicines:                See the Anesthesia note for documentation of the                            administered medications Procedure:                Pre-Anesthesia Assessment:                           - Prior to the procedure, a History and Physical                            was performed, and patient medications and                            allergies were reviewed. The patient's tolerance of                            previous anesthesia was also reviewed. The risks                            and benefits of the procedure and the sedation                            options and risks were discussed with the patient.                            All questions were answered, and informed consent                            was obtained. Prior Anticoagulants: The patient has  taken no previous anticoagulant or antiplatelet                            agents. ASA Grade Assessment: II - A patient with                            mild systemic disease. After reviewing the risks                            and benefits, the patient was deemed in                            satisfactory condition to undergo the procedure.                           After obtaining informed consent, the colonoscope   was passed under direct vision. Throughout the                            procedure, the patient's blood pressure, pulse, and                            oxygen saturations were monitored continuously. The                            Colonoscope was introduced through the anus and                            advanced to the the terminal ileum, with                            identification of the appendiceal orifice and IC                            valve. A second forward view of the right colon was                            performed. The colonoscopy was performed without                            difficulty. The patient tolerated the procedure                            well. The quality of the bowel preparation was                            good. The terminal ileum, ileocecal valve,                            appendiceal orifice, and rectum were photographed. Scope In: 3:45:50 PM Scope Out: 3:57:07 PM Scope Withdrawal Time: 0 hours 9 minutes 17 seconds  Total Procedure Duration: 0 hours 11 minutes 17 seconds  Findings:  The perianal and digital rectal examinations were                            normal.                           A few small-mouthed diverticula were found in the                            sigmoid colon. No diverticulitis was seen.                           A 3 mm polyp was found in the hepatic flexure. The                            polyp was sessile. The polyp was removed with a                            cold snare. Resection and retrieval were complete.                            Estimated blood loss was minimal.                           A less than 1 mm polyp was found in the cecum. The                            polyp was sessile. The polyp was removed with a                            cold biopsy forceps. Resection and retrieval were                            complete. Estimated blood loss: none.                           A localized area of  mildly erythematous mucosa was                            found in the mid ascending colon. Biopsies were                            taken with a cold forceps for histology. Estimated                            blood loss was minimal.                           The exam was otherwise without abnormality on                            direct and retroflexion views. Complications:            No immediate complications. Estimated blood loss:  Minimal. Estimated Blood Loss:     Estimated blood loss was minimal. Impression:               - Diverticulosis in the sigmoid colon.                           - One 3 mm polyp at the hepatic flexure, removed                            with a cold snare. Resected and retrieved.                           - One less than 1 mm polyp in the cecum, removed                            with a cold biopsy forceps. Resected and retrieved.                           - Erythematous mucosa in the mid ascending colon.                            Biopsied.                           - The examination was otherwise normal on direct                            and retroflexion views. Recommendation:           - Patient has a contact number available for                            emergencies. The signs and symptoms of potential                            delayed complications were discussed with the                            patient. Return to normal activities tomorrow.                            Written discharge instructions were provided to the                            patient.                           - Resume previous diet. High fiber diet encouraged.                           - Continue present medications.                           - Await pathology results.                           -  Repeat colonoscopy in 5 years for surveillance                            given the family history of colon cancer. Thornton Park MD, MD 06/30/2019  4:09:08 PM This report has been signed electronically.

## 2019-07-02 ENCOUNTER — Telehealth: Payer: Self-pay

## 2019-07-02 NOTE — Telephone Encounter (Signed)
  Follow up Call-  Call back number 06/30/2019  Post procedure Call Back phone  # [phone]  Permission to leave phone message Yes     Patient questions:  Do you have a fever, pain , or abdominal swelling? No. Pain Score  0 *  Have you tolerated food without any problems? Yes.    Have you been able to return to your normal activities? Yes.    Do you have any questions about your discharge instructions: Diet   No. Medications  No. Follow up visit  No.  Do you have questions or concerns about your Care? No.  Actions: * If pain score is 4 or above: No action needed, pain <4.  Have you developed a fever since your procedure? No 2.   Have you had an respiratory symptoms (SOB or cough) since your procedure? No  3.   Have you tested positive for COVID 19 since your procedure No  4.   Have you had any family members/close contacts diagnosed with the COVID 19 since your procedure?  No   If yes to any of these questions please route to Joylene John, RN and Alphonsa Gin, RN.

## 2019-07-14 ENCOUNTER — Ambulatory Visit: Payer: Self-pay

## 2019-07-14 NOTE — Telephone Encounter (Signed)
Provided covid test results.   care advice for patient related to covid-19.  Voiced understanding.   Pt.  Request a steroid injection.

## 2019-07-14 NOTE — Telephone Encounter (Signed)
See note

## 2019-07-14 NOTE — Telephone Encounter (Signed)
Provided Covod lab results

## 2019-07-15 NOTE — Telephone Encounter (Signed)
FYI

## 2019-07-24 ENCOUNTER — Ambulatory Visit (INDEPENDENT_AMBULATORY_CARE_PROVIDER_SITE_OTHER): Payer: Managed Care, Other (non HMO) | Admitting: Family Medicine

## 2019-07-24 ENCOUNTER — Encounter: Payer: Self-pay | Admitting: Family Medicine

## 2019-07-24 ENCOUNTER — Ambulatory Visit: Payer: Managed Care, Other (non HMO) | Admitting: Family Medicine

## 2019-07-24 VITALS — Ht 71.0 in | Wt 199.0 lb

## 2019-07-24 DIAGNOSIS — U071 COVID-19: Secondary | ICD-10-CM

## 2019-07-24 MED ORDER — GUAIFENESIN-CODEINE 100-10 MG/5ML PO SOLN: 10.0000 mL | Freq: Three times a day (TID) | ORAL | 0 refills | Status: DC | PRN

## 2019-07-24 MED ORDER — BENZONATATE 200 MG PO CAPS: 200.0000 mg | ORAL_CAPSULE | Freq: Three times a day (TID) | ORAL | 0 refills | Status: DC | PRN

## 2019-07-24 NOTE — Progress Notes (Signed)
Patient: Frederick Martin MRN: [id number] DOB: 08-Jan-1971 PCP: [name], DO     I connected with [name] on 07/24/19 at 9:43am by a video enabled telemedicine application and verified that I am speaking with the correct person using two identifiers.  Location patient: Home Location provider: [facility], Office Persons participating in this virtual visit: Cass Rill and Dr. Rogers Blocker   I discussed the limitations of evaluation and management by telemedicine and the availability of in person appointments. The patient expressed understanding and agreed to proceed.   Interactive audio and video telecommunications were attempted between this provider and patient, however failed, due to patient having technical difficulties OR patient did not have access to video capability.  We continued and completed visit with audio only.    Subjective:  Chief Complaint  Patient presents with  . Cough    covid 19 infection     HPI: The patient is a 48 y.o. male who presents today for cough from covid 19. He was diagnosed 10 days ago from Sunday. He states the past 2-3 days he has started to feel normal. He states he has had a lagging cough and would like to get something for this. He has had no fevers. He gets winded if he goes up stairs and is fatigued, but otherwise feels pretty good. Dry cough only. He does have asthma, but feels fine. He has not taken anything over the counter. He has improving symptoms and has been fever free for quite some time. He is still quarantining at home.   Review of Systems  Constitutional: Positive for fatigue. Negative for chills.  HENT: Positive for congestion, ear pain, rhinorrhea and sore throat.   Respiratory: Positive for cough, chest tightness and shortness of breath.   Cardiovascular: Negative for chest pain, palpitations and leg swelling.  Gastrointestinal: Negative for abdominal pain, diarrhea and vomiting.  Musculoskeletal: Negative for back pain,  myalgias, neck pain and neck stiffness.  Neurological: Positive for headaches. Negative for dizziness.    Allergies Patient has No Known Allergies.  Past Medical History Patient  has a past medical history of Asthma, GERD (gastroesophageal reflux disease), Heart murmur, and MVP (mitral valve prolapse).  Surgical History Patient  has a past surgical history that includes Wisdom tooth extraction (2001).  Family History Pateint's family history includes Cancer in his maternal grandmother; Colon cancer in his father and paternal grandfather; Depression in his father and mother; Diabetes in his father; Esophageal cancer in his maternal grandfather; Heart disease in his father; Hyperlipidemia in his father; Hypertension in his father.  Social History Patient  reports that he has never smoked. He has never used smokeless tobacco. He reports current alcohol use. He reports that he does not use drugs.    Objective: Vitals:   07/24/19 0922  Weight: 199 lb (90.3 kg)  Height: 5\' 11"  (1.803 m)    Body mass index is 27.75 kg/m.  Physical Exam Vitals signs reviewed.  Constitutional:      Appearance: Normal appearance.  HENT:     Head: Normocephalic and atraumatic.  Pulmonary:     Effort: Pulmonary effort is normal. No respiratory distress.  Neurological:     General: No focal deficit present.     Mental Status: He is alert and oriented to person, place, and time.        Assessment/plan: 1. COVID-19 -much improved, just lingering dry cough. Continue supportive therapy with cool mist humidifier. I recommended honey daily. Will also do robitussin during day and  will send in codeine cough syrup at night. Drowsy precautions given. Can also do trial of tessalon pearls. Discussed this could last a while as some people experience extended covid symptoms. He is doing well with no fever for much longer than 24 hours and improvement in symptoms past 14 days. Okay to return to work.     Return  if symptoms worsen or fail to improve.     Orma Flaming, MD [place]  07/24/2019

## 2019-07-31 ENCOUNTER — Telehealth: Payer: Self-pay | Admitting: *Deleted

## 2019-07-31 NOTE — Telephone Encounter (Signed)
Thank you! I replaced his pathology results in my inbox and I will follow-up from there.

## 2019-08-06 DIAGNOSIS — E291 Testicular hypofunction: Secondary | ICD-10-CM | POA: Insufficient documentation

## 2019-08-14 ENCOUNTER — Telehealth: Payer: Self-pay | Admitting: *Deleted

## 2019-08-14 NOTE — Telephone Encounter (Signed)
Referral for food allergy testing faxed to Dr. Rosalyn Gess office.

## 2019-09-05 ENCOUNTER — Encounter (INDEPENDENT_AMBULATORY_CARE_PROVIDER_SITE_OTHER): Payer: Self-pay | Admitting: Family Medicine

## 2019-09-08 NOTE — Telephone Encounter (Signed)
Called pt to schedule appt, no answer, LVM.

## 2019-09-08 NOTE — Telephone Encounter (Signed)
Please review

## 2019-09-10 ENCOUNTER — Encounter: Payer: Self-pay | Admitting: Family Medicine

## 2019-09-10 ENCOUNTER — Ambulatory Visit (INDEPENDENT_AMBULATORY_CARE_PROVIDER_SITE_OTHER): Payer: Managed Care, Other (non HMO) | Admitting: Family Medicine

## 2019-09-10 DIAGNOSIS — G478 Other sleep disorders: Secondary | ICD-10-CM | POA: Diagnosis not present

## 2019-09-10 NOTE — Progress Notes (Signed)
Virtual Visit via Video Note  Subjective  CC:  Chief Complaint  Patient presents with  . Insomnia    Difficulty staying asleep.      I connected with Frederick Martin on 09/10/19 at  3:20 PM EST by a video enabled telemedicine application and verified that I am speaking with the correct person using two identifiers. Location patient: Home Location provider: [facility], Office Persons participating in the virtual visit: Dex Ganschow, Leamon Arnt, MD Gertie Exon, CMA  I discussed the limitations of evaluation and management by telemedicine and the availability of in person appointments. The patient expressed understanding and agreed to proceed. HPI: Frederick Martin is a 48 y.o. male who was contacted today to address the problems listed above in the chief complaint.  48 yo male who reports 2-3x/month he will awaken to go to the bathroom or roll over and then has trouble falling back asleep. Ongoing x about 5 months. No clear pattern. At times it is due to thinking about things but other times no apparent reason other than he is just wide awake. No other sxs. No untoward affects. Functions well the next day. Has used cbd oil with good results. Can't tolerate melatonin due to SE. Hasn't associated it with food/drink, activity, stress, snoring and has good sleep hygeine.   Assessment  1. Poor sleep pattern      Plan   Reassuring history:  Very occassional sleep disturbance. No sxs of osa, RLS, PLMD or anxiety stress d/o. Sleep hygiene discussed and otc mgt is fine for now. F/u if worsens.  I discussed the assessment and treatment plan with the patient. The patient was provided an opportunity to ask questions and all were answered. The patient agreed with the plan and demonstrated an understanding of the instructions.   The patient was advised to call back or seek an in-person evaluation if the symptoms worsen or if the condition fails to improve as  anticipated. Follow up: No follow-ups on file.  Visit date not found  No orders of the defined types were placed in this encounter.     I reviewed the patients updated PMH, FH, and SocHx.    Patient Active Problem List   Diagnosis Date Noted  . Chicken pox 04/28/2019  . Heart murmur 04/23/2019  . Asthma 04/23/2019  . Family history of colon cancer in father 04/05/2018  . Gastroesophageal reflux disease 03/07/2017  . Mild intermittent asthma without complication [id number]  . Erectile dysfunction 06/28/2016  . MVP (mitral valve prolapse) 03/21/2016  . Allergic rhinitis 07/24/2011   No outpatient medications have been marked as taking for the 09/10/19 encounter (Office Visit) with Leamon Arnt, MD.    Allergies: Patient has No Known Allergies. Family History: Patient family history includes Cancer in his maternal grandmother; Colon cancer in his father and paternal grandfather; Depression in his father and mother; Diabetes in his father; Esophageal cancer in his maternal grandfather; Heart disease in his father; Hyperlipidemia in his father; Hypertension in his father. Social History:  Patient  reports that he has never smoked. He has never used smokeless tobacco. He reports current alcohol use. He reports that he does not use drugs.  Review of Systems: Constitutional: Negative for fever malaise or anorexia Cardiovascular: negative for chest pain Respiratory: negative for SOB or persistent cough Gastrointestinal: negative for abdominal pain  OBJECTIVE Vitals: There were no vitals taken for this visit. General: no acute distress , A&Ox3  Leamon Arnt, MD

## 2019-12-07 ENCOUNTER — Ambulatory Visit: Payer: Managed Care, Other (non HMO) | Attending: Internal Medicine

## 2019-12-07 DIAGNOSIS — Z23 Encounter for immunization: Secondary | ICD-10-CM | POA: Insufficient documentation

## 2019-12-07 NOTE — Progress Notes (Signed)
   Covid-19 Vaccination Clinic  Name:  Frederick Martin    MRN: [id number] DOB: November 17, 1970  12/07/2019  [name] was observed post Covid-19 immunization for 15 minutes without incident. He was provided with Vaccine Information Sheet and instruction to access the V-Safe system.   [name] was instructed to call 911 with any severe reactions post vaccine: Marland Kitchen Difficulty breathing  . Swelling of face and throat  . A fast heartbeat  . A bad rash all over body  . Dizziness and weakness   Immunizations Administered    Name Date Dose VIS Date Route   Pfizer COVID-19 Vaccine 12/07/2019  6:40 PM 0.3 mL 09/12/2019 Intramuscular   Manufacturer: [place]   Lot: [id number]   [place]: [id number]

## 2019-12-28 IMAGING — CR DG FOREARM 2V*L*
2 series · 2 of 2 positions shown · non-contrast
Comparison: None.

CLINICAL DATA: Left arm pain following an injury.

EXAM:
LEFT FOREARM - 2 VIEW

[x forearm ap left]
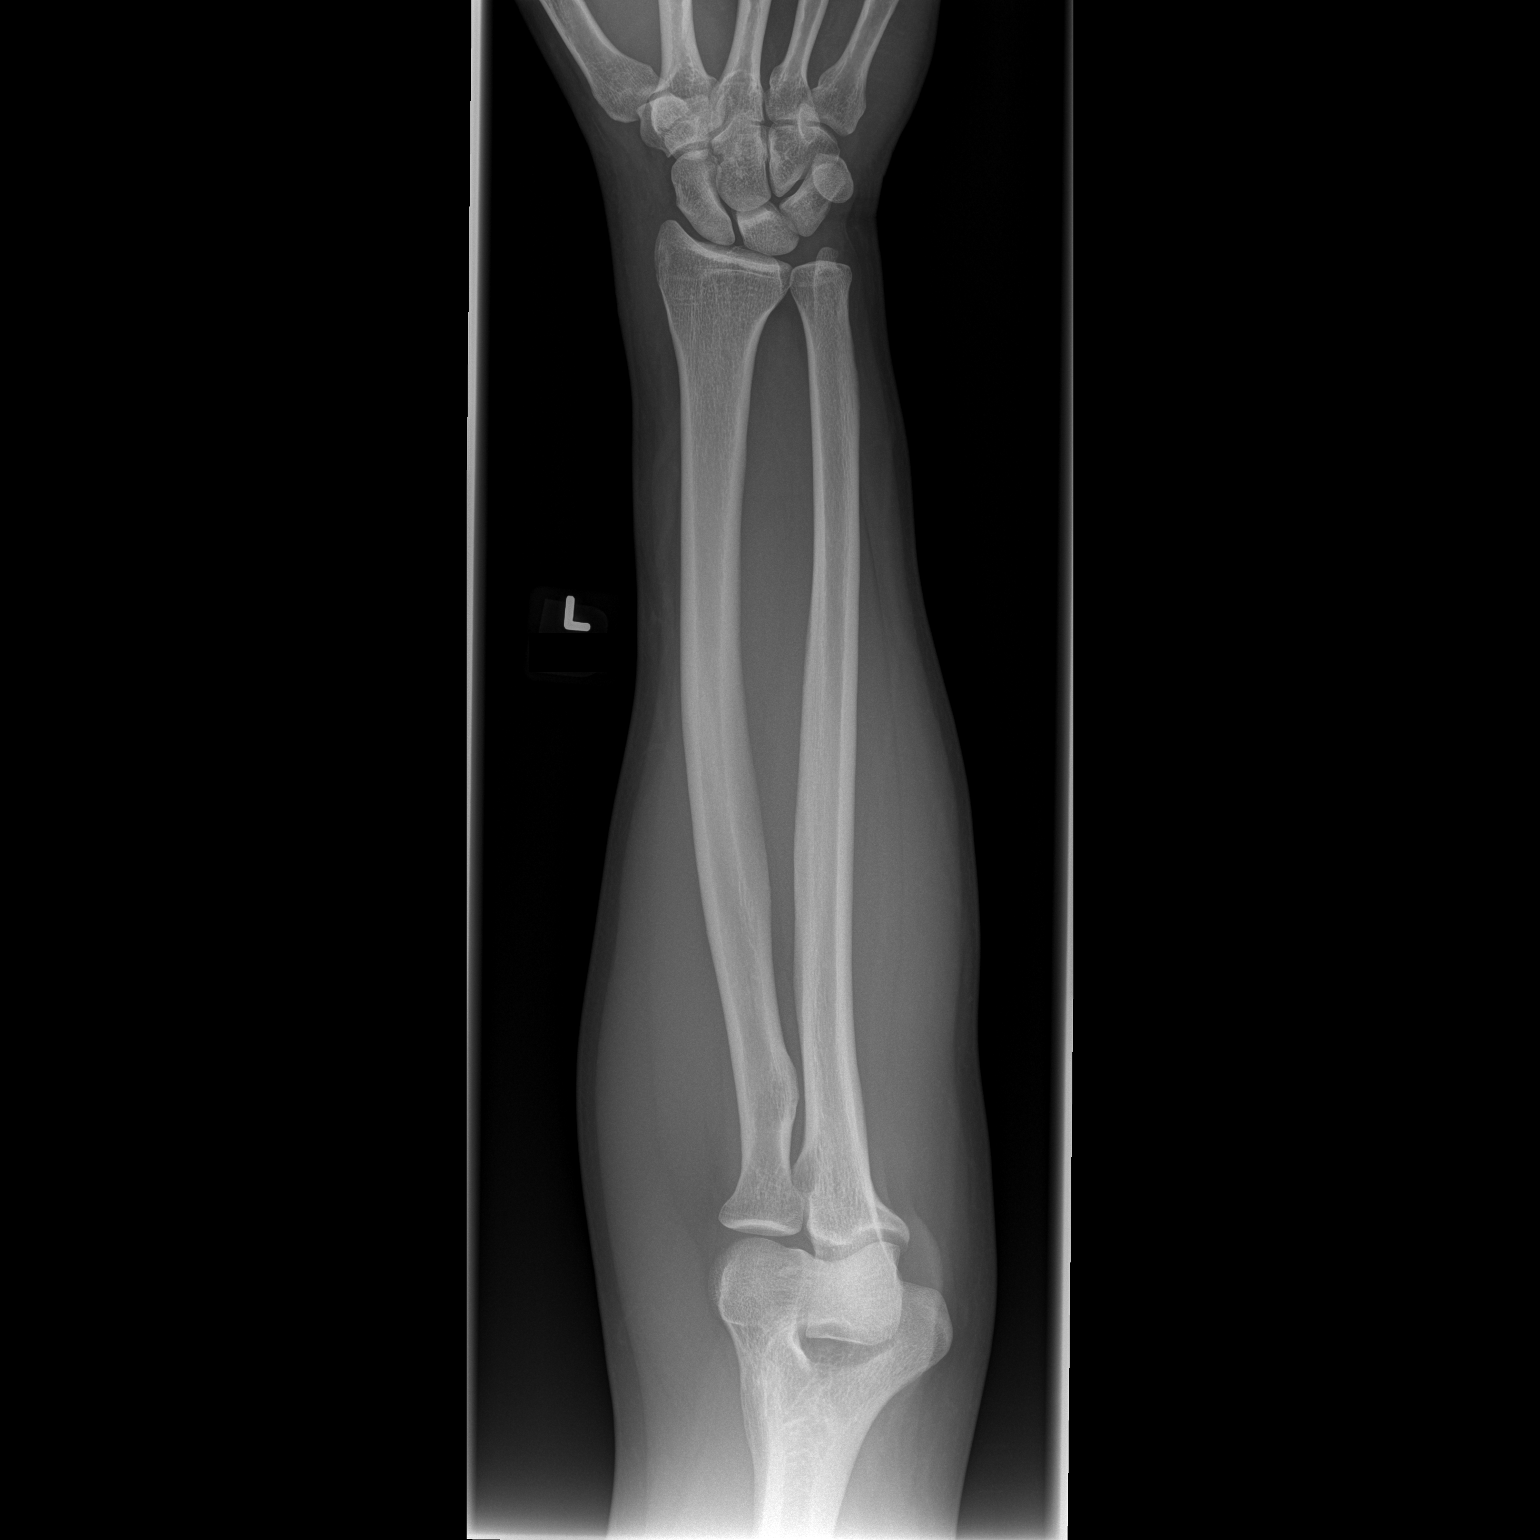

[x forearm lat left]
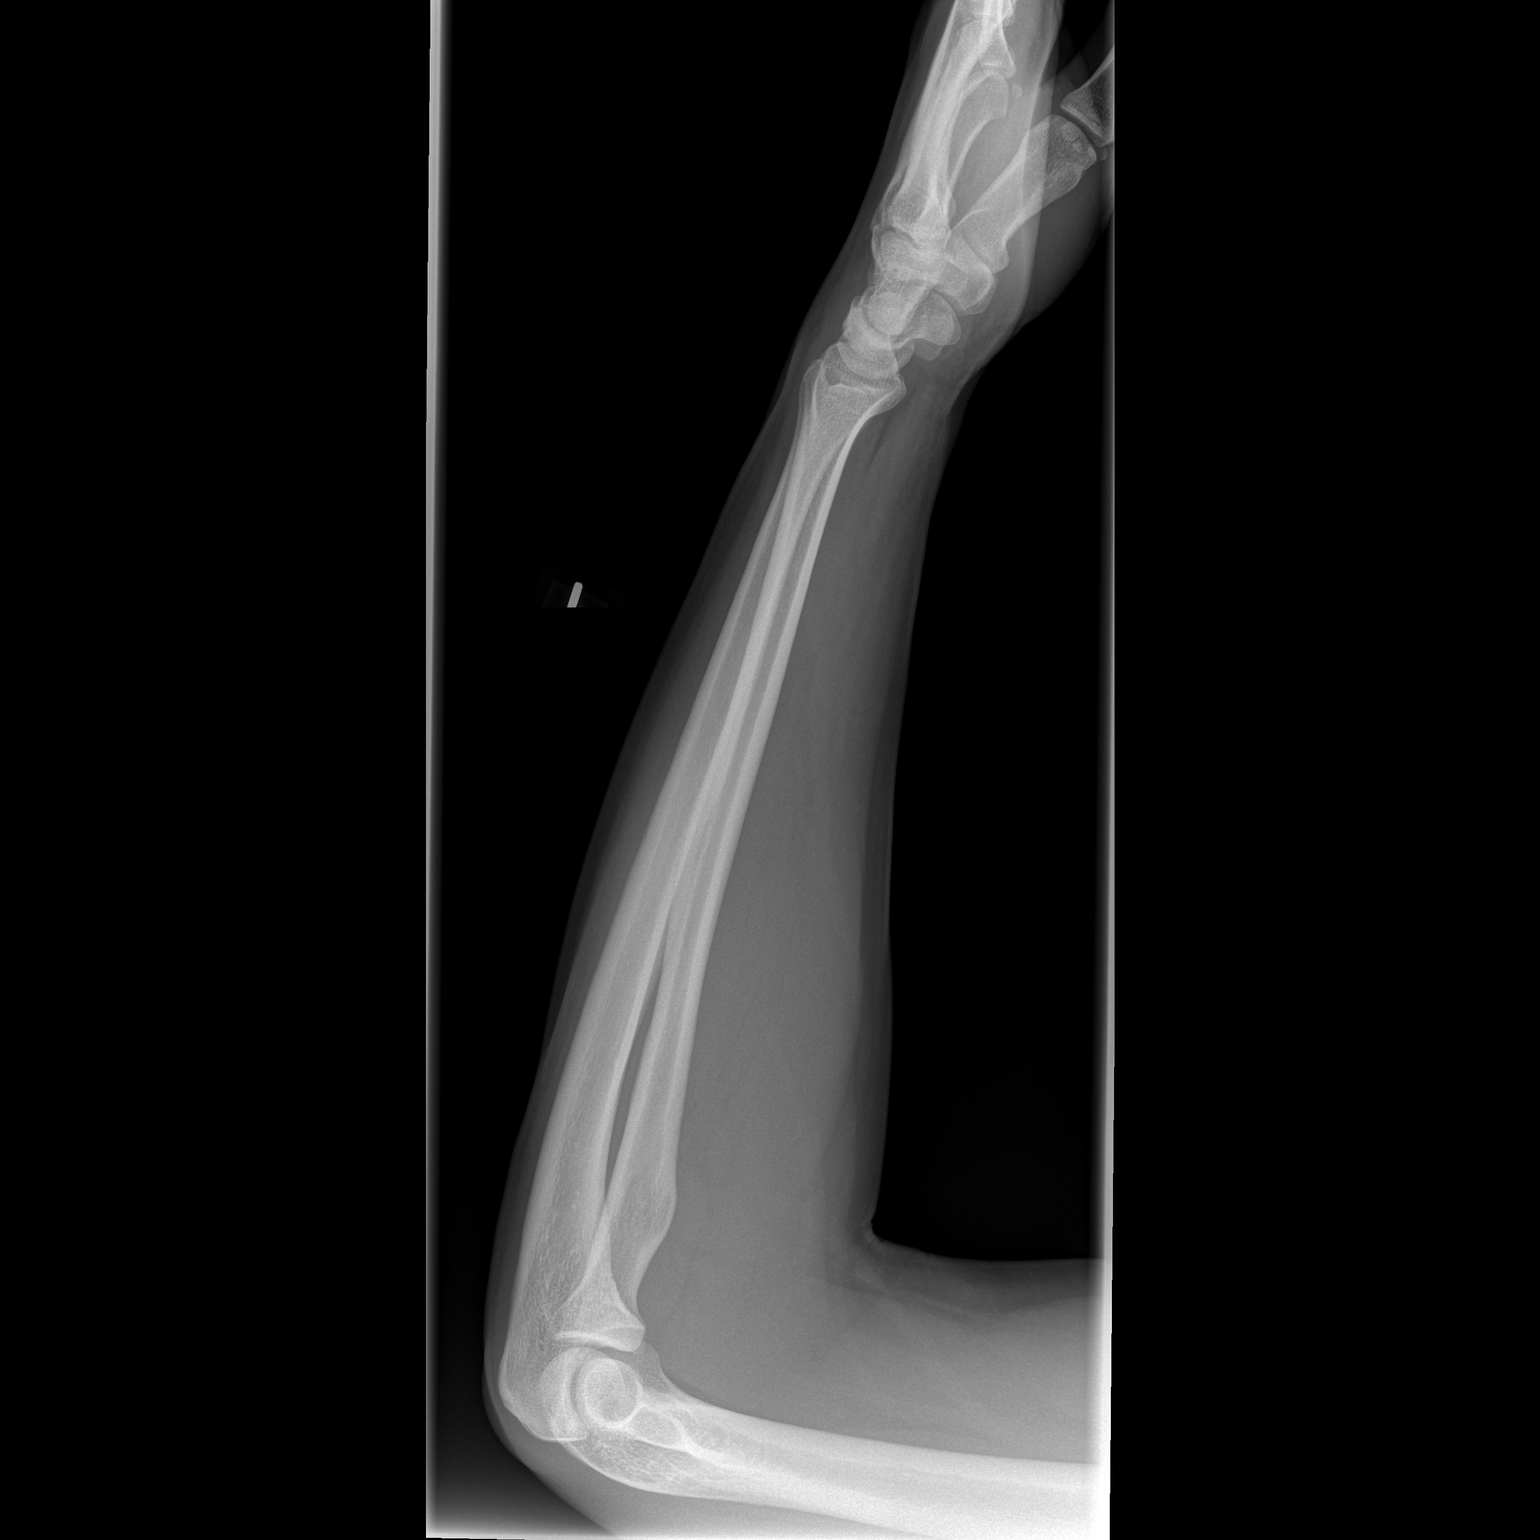

[2 of 2 positions shown; findings below may reference images not displayed]

FINDINGS: There is no evidence of fracture or other focal bone lesions. Soft
tissues are unremarkable.
IMPRESSION: Normal examination.

## 2020-01-07 ENCOUNTER — Ambulatory Visit: Payer: Managed Care, Other (non HMO) | Attending: Internal Medicine

## 2020-01-07 DIAGNOSIS — Z23 Encounter for immunization: Secondary | ICD-10-CM

## 2020-01-07 NOTE — Progress Notes (Signed)
   Covid-19 Vaccination Clinic  Name:  Frederick Martin    MRN: [id number] DOB: July 14, 1971  01/07/2020  [name] was observed post Covid-19 immunization for 15 minutes without incident. He was provided with Vaccine Information Sheet and instruction to access the V-Safe system.   [name] was instructed to call 911 with any severe reactions post vaccine: Marland Kitchen Difficulty breathing  . Swelling of face and throat  . A fast heartbeat  . A bad rash all over body  . Dizziness and weakness   Immunizations Administered    Name Date Dose VIS Date Route   Pfizer COVID-19 Vaccine 01/07/2020  3:28 PM 0.3 mL 09/12/2019 Intramuscular   Manufacturer: [place]   Lot: [id number]   [place]: [id number]

## 2020-01-16 ENCOUNTER — Encounter: Payer: Self-pay | Admitting: Family Medicine

## 2020-01-16 NOTE — Telephone Encounter (Signed)
See messages. Please send letter that Mr. Abdala is "cleared to fly internationally" and will be providing a negative covid test prior to travel.   Thanks

## 2020-04-23 ENCOUNTER — Encounter: Payer: Managed Care, Other (non HMO) | Admitting: Family Medicine

## 2020-05-10 ENCOUNTER — Encounter: Payer: Managed Care, Other (non HMO) | Admitting: Family Medicine

## 2020-05-18 ENCOUNTER — Other Ambulatory Visit: Payer: Self-pay

## 2020-05-18 ENCOUNTER — Encounter: Payer: Self-pay | Admitting: Family Medicine

## 2020-05-18 ENCOUNTER — Ambulatory Visit (INDEPENDENT_AMBULATORY_CARE_PROVIDER_SITE_OTHER): Payer: Managed Care, Other (non HMO) | Admitting: Family Medicine

## 2020-05-18 VITALS — BP 150/90 | HR 75 | Temp 98.0°F | Ht 71.0 in | Wt 191.2 lb

## 2020-05-18 DIAGNOSIS — K219 Gastro-esophageal reflux disease without esophagitis: Secondary | ICD-10-CM | POA: Diagnosis not present

## 2020-05-18 DIAGNOSIS — J301 Allergic rhinitis due to pollen: Secondary | ICD-10-CM

## 2020-05-18 DIAGNOSIS — Z1159 Encounter for screening for other viral diseases: Secondary | ICD-10-CM

## 2020-05-18 DIAGNOSIS — J4520 Mild intermittent asthma, uncomplicated: Secondary | ICD-10-CM | POA: Diagnosis not present

## 2020-05-18 DIAGNOSIS — E291 Testicular hypofunction: Secondary | ICD-10-CM | POA: Diagnosis not present

## 2020-05-18 DIAGNOSIS — Z0000 Encounter for general adult medical examination without abnormal findings: Secondary | ICD-10-CM | POA: Diagnosis not present

## 2020-05-18 DIAGNOSIS — G250 Essential tremor: Secondary | ICD-10-CM

## 2020-05-18 DIAGNOSIS — R030 Elevated blood-pressure reading, without diagnosis of hypertension: Secondary | ICD-10-CM

## 2020-05-18 DIAGNOSIS — R202 Paresthesia of skin: Secondary | ICD-10-CM

## 2020-05-18 NOTE — Patient Instructions (Signed)
Please return in 12 months for your annual complete physical; please come fasting. Please check your blood pressures outside of the office over the next several weeks and send me a message with the readings. Normal is < 140/90; 120s/70s is ideal.   I will release your lab results to you on your MyChart account with further instructions. Please reply with any questions.   I have placed a referral for a nerve conduction test for your left hand. We will call you to get scheduled.   It was a pleasure meeting you today! Thank you for choosing [place] to meet your healthcare needs! I truly look forward to working with you. If you have any questions or concerns, please send me a message via Mychart or call the office at [phone].  Please do these things to maintain good health!   Exercise at least 30-45 minutes a day,  4-5 days a week.   Eat a low-fat diet with lots of fruits and vegetables, up to 7-9 servings per day.  Drink plenty of water daily. Try to drink 8 8oz glasses per day.  Seatbelts can save your life. Always wear your seatbelt.  Place Smoke Detectors on every level of your home and check batteries every year.  Eye Doctor - have an eye exam every 1-2 years  Safe sex - use condoms to protect yourself from STDs if you could be exposed to these types of infections.  Avoid heavy alcohol use. If you drink, keep it to less than 2 drinks/day and not every day.  [place].  Choose someone you trust that could speak for you if you became unable to speak for yourself.  Depression is common in our stressful world.If you're feeling down or losing interest in things you normally enjoy, please come in for a visit.

## 2020-05-18 NOTE — Progress Notes (Signed)
Subjective  Chief Complaint  Patient presents with  . Transitions Of Care  . Annual Exam    non-fasting     HPI: Frederick Martin is a 49 y.o. male who presents to [facility] [place] today for a Male Wellness Visit. He also has the concerns and/or needs as listed above in the chief complaint. These will be addressed in addition to the Health Maintenance Visit.   Wellness Visit: annual visit with health maintenance review and exam    nonfasting for labs. Overall healthy male with h/o MIA as child, no recent flares, allergies, MVP w/o occ lightheadedness w/o reflux by echocardiogram in the 90s, low T and ED followed by urology, and FH of colon cancer w/ nl colonoscopy recently by Dr. Tarri Glenn. All chronic problems are stable. Currently, living with wife but working towards divorce; stressful times. Feels stressed and feels elevated bp is due to that today. No h/o HTN. Healthy lifestyle. Exercises regularly. Nonsmoker. Likes his job.  Lifestyle: Body mass index is 26.67 kg/m. Wt Readings from Last 3 Encounters:  05/18/20 191 lb 3.2 oz (86.7 kg)  07/24/19 199 lb (90.3 kg)  06/30/19 204 lb (92.5 kg)    Chronic disease management visit and/or acute problem visit:  As above, well controlled. No rescue inhaler use in over several years. Rare GERD. No cp. Sees urology.   C/o 3 months of numbness and tingling in left hand, mainly fingers 2-4. Rare thumb, never pinky. Not positional. Constant. Decreased sensation w/o color changes in fingers. No weakness. Works at a [employer]. No right hand nor feet symptoms. Annoying but not painful.   Patient Active Problem List   Diagnosis Date Noted  . Male hypogonadism 08/06/2019  . Family history of colon cancer in father 04/05/2018  . Gastroesophageal reflux disease 03/07/2017  . Mild intermittent asthma without complication 92/08/9416  . Erectile dysfunction 06/28/2016  . MVP (mitral valve prolapse) 03/21/2016  . Allergic rhinitis  07/24/2011   Health Maintenance  Topic Date Due  . Hepatitis C Screening  Never done  . INFLUENZA VACCINE  05/02/2020  . TETANUS/TDAP  04/01/2021  . COLONOSCOPY  06/29/2024  . COVID-19 Vaccine  Completed  . HIV Screening  Completed   Immunization History  Administered Date(s) Administered  . Influenza Split 07/11/2013  . Influenza, Quadrivalent, Recombinant, Inj, Pf 08/13/2016  . Influenza,inj,Quad PF,6+ Mos 11/09/2018  . Influenza,inj,quad, With Preservative 07/28/2017  . PFIZER SARS-COV-2 Vaccination 12/07/2019, 01/07/2020  . Pneumococcal Polysaccharide-23 04/05/2018  . Tdap 04/02/2011   We updated and reviewed the patient's past history in detail and it is documented below. Allergies: Patient has No Known Allergies. Past Medical History  has a past medical history of Asthma, GERD (gastroesophageal reflux disease), Heart murmur, and MVP (mitral valve prolapse). Past Surgical History Patient  has a past surgical history that includes Wisdom tooth extraction (2001). Social History Patient  reports that he has never smoked. He has never used smokeless tobacco. He reports current alcohol use. He reports that he does not use drugs. Family History family history includes Cancer in his maternal grandmother; Colon cancer in his father and paternal grandfather; Depression in his father and mother; Diabetes in his father; Esophageal cancer in his maternal grandfather; Heart disease in his father; Hyperlipidemia in his father; Hypertension in his father. Review of Systems: Constitutional: negative for fever or malaise Ophthalmic: negative for photophobia, double vision or loss of vision Cardiovascular: negative for chest pain, dyspnea on exertion, or new LE swelling Respiratory:  negative for SOB or persistent cough Gastrointestinal: negative for abdominal pain, change in bowel habits or melena Genitourinary: negative for dysuria or gross hematuria Musculoskeletal: negative for new gait  disturbance or muscular weakness Integumentary: negative for new or persistent rashes Neurological: negative for TIA or stroke symptoms Psychiatric: negative for SI or delusions Allergic/Immunologic: negative for hives  Patient Care Team    Relationship Specialty Notifications Start End  Leamon Arnt, MD PCP - General Family Medicine  05/18/20   Thornton Park, MD Consulting Physician Gastroenterology  05/18/20   Butch Penny, MD Referring Physician Urology  05/18/20    BP Readings from Last 3 Encounters:  05/18/20 (!) 150/90  06/30/19 125/77  05/27/19 (!) 152/90    Objective  Vitals: BP (!) 150/90   Pulse 75   Temp 98 F (36.7 C) (Temporal)   Ht 5\' 11"  (1.803 m)   Wt 191 lb 3.2 oz (86.7 kg)   SpO2 98%   BMI 26.67 kg/m  General:  Well developed, well nourished, no acute distress  Psych:  Alert and orientedx3,normal mood and affect HEENT:  Normocephalic, atraumatic, non-icteric sclera, PERRL, oropharynx is clear without mass or exudate, supple neck without adenopathy, mass or thyromegaly Cardiovascular:  Normal S1, S2, RRR without gallop, + systolic murmur, nondisplaced PMI, +2 distal pulses in bilateral upper and lower extremities. Respiratory:  Good breath sounds bilaterally, CTAB with normal respiratory effort Gastrointestinal: normal bowel sounds, soft, non-tender, no noted masses. No HSM MSK: no deformities, contusions. Joints are without erythema or swelling. Spine and CVA region are nontender Skin:  Warm, no rashes or suspicious lesions noted Neurologic:    Mental status is normal. CN 2-11 are normal. Gross motor and sensory exams are normal. Stable gait. + tremor bilateral hands, tremor with finger to nose w/o ataxia, neg phalens. Nl grip strength.  GU: No inguinal hernias or adenopathy are appreciated bilaterally   Assessment  1. Annual physical exam   2. Seasonal allergic rhinitis due to pollen   3. Gastroesophageal reflux disease, unspecified whether  esophagitis present   4. Mild intermittent asthma without complication   5. Male hypogonadism   6. Need for hepatitis C screening test   7. Elevated blood pressure reading without diagnosis of hypertension   8. Left hand paresthesia   9. Essential tremor      Plan  Male Wellness Visit:  Age appropriate Health Maintenance and Prevention measures were discussed with patient. Included topics are cancer screening recommendations, ways to keep healthy (see AVS) including dietary and exercise recommendations, regular eye and dental care, use of seat belts, and avoidance of moderate alcohol use and tobacco use. Screens up to date  BMI: discussed patient's BMI and encouraged positive lifestyle modifications to help get to or maintain a target BMI.  HM needs and immunizations were addressed and ordered. See below for orders. See HM and immunization section for updates. All up to date  Routine labs and screening tests ordered including cmp, cbc and lipids where appropriate.  Discussed recommendations regarding Vit D and calcium supplementation (see AVS)  Chronic disease f/u and/or acute problem visit: (deemed necessary to be done in addition to the wellness visit):  Elevated BP: rec home monitoring and return if not normalized. Suspect stress reaction.   GERD, MIA and low T are controlled.   Left hand paresthesias: Atypical for carpal tunnels but still most likely diagnosis.  Check lab work and EMG nerve conduction studies to clarify diagnosis.  Treatment based on results.  Follow  up: Return in about 1 year (around 05/18/2021) for complete physical.   Commons side effects, risks, benefits, and alternatives for medications and treatment plan prescribed today were discussed, and the patient expressed understanding of the given instructions. Patient is instructed to call or message via MyChart if he/she has any questions or concerns regarding our treatment plan. No barriers to understanding were  identified. We discussed Red Flag symptoms and signs in detail. Patient expressed understanding regarding what to do in case of urgent or emergency type symptoms.   Medication list was reconciled, printed and provided to the patient in AVS. Patient instructions and summary information was reviewed with the patient as documented in the AVS. This note was prepared with assistance of Dragon voice recognition software. Occasional wrong-word or sound-a-like substitutions may have occurred due to the inherent limitations of voice recognition software  This visit occurred during the SARS-CoV-2 public health emergency.  Safety protocols were in place, including screening questions prior to the visit, additional usage of staff PPE, and extensive cleaning of exam room while observing appropriate contact time as indicated for disinfecting solutions.   Orders Placed This Encounter  Procedures  . CBC with Differential/Platelet  . Comprehensive metabolic panel  . Lipid panel  . Hepatitis C antibody  . B12 and Folate Panel  . TSH  . NCV with EMG(electromyography)   No orders of the defined types were placed in this encounter.

## 2020-05-19 LAB — LIPID PANEL
Cholesterol: 141 mg/dL (ref <200)
HDL: 50 mg/dL (ref >=40)
LDL Cholesterol (Calc): 76 mg/dL (calc)
Non-HDL Cholesterol (Calc): 91 mg/dL (calc) (ref <130)
Total CHOL/HDL Ratio: 2.8 (calc) (ref <5.0)
Triglycerides: 69 mg/dL (ref <150)

## 2020-05-19 LAB — COMPREHENSIVE METABOLIC PANEL
AG Ratio: 1.9 (calc) (ref 1.0–2.5)
ALT: 25 U/L (ref 9–46)
AST: 21 U/L (ref 10–40)
Albumin: 4.3 g/dL (ref 3.6–5.1)
Alkaline phosphatase (APISO): 28 U/L — ABNORMAL LOW (ref 36–130)
BUN: 12 mg/dL (ref 7–25)
CO2: 29 mmol/L (ref 20–32)
Calcium: 9.4 mg/dL (ref 8.6–10.3)
Chloride: 99 mmol/L (ref 98–110)
Creat: 0.93 mg/dL (ref 0.60–1.35)
Globulin: 2.3 g/dL (calc) (ref 1.9–3.7)
Glucose, Bld: 80 mg/dL (ref 65–99)
Potassium: 4.3 mmol/L (ref 3.5–5.3)
Sodium: 136 mmol/L (ref 135–146)
Total Bilirubin: 0.6 mg/dL (ref 0.2–1.2)
Total Protein: 6.6 g/dL (ref 6.1–8.1)

## 2020-05-19 LAB — CBC WITH DIFFERENTIAL/PLATELET
Absolute Monocytes: 752 cells/uL (ref 200–950)
Basophils Absolute: 68 cells/uL (ref 0–200)
Basophils Relative: 0.9 %
Eosinophils Absolute: 395 cells/uL (ref 15–500)
Eosinophils Relative: 5.2 %
HCT: 52.1 % — ABNORMAL HIGH (ref 38.5–50.0)
Hemoglobin: 17.5 g/dL — ABNORMAL HIGH (ref 13.2–17.1)
Lymphs Abs: 1034 cells/uL (ref 850–3900)
MCH: 30 pg (ref 27.0–33.0)
MCHC: 33.6 g/dL (ref 32.0–36.0)
MCV: 89.4 fL (ref 80.0–100.0)
MPV: 10.6 fL (ref 7.5–12.5)
Monocytes Relative: 9.9 %
Neutro Abs: 5350 cells/uL (ref 1500–7800)
Neutrophils Relative %: 70.4 %
Platelets: 292 10*3/uL (ref 140–400)
RBC: 5.83 10*6/uL — ABNORMAL HIGH (ref 4.20–5.80)
RDW: 12.6 % (ref 11.0–15.0)
Total Lymphocyte: 13.6 %
WBC: 7.6 10*3/uL (ref 3.8–10.8)

## 2020-05-19 LAB — HEPATITIS C ANTIBODY
Hepatitis C Ab: NONREACTIVE
SIGNAL TO CUT-OFF: 0.01 (ref <1.00)

## 2020-05-19 LAB — B12 AND FOLATE PANEL
Folate: 6.8 ng/mL
Vitamin B-12: 664 pg/mL (ref 200–1100)

## 2020-05-19 LAB — TSH: TSH: 1.42 mIU/L (ref 0.40–4.50)

## 2020-06-07 ENCOUNTER — Encounter: Payer: Self-pay | Admitting: Family Medicine

## 2020-06-08 ENCOUNTER — Encounter: Payer: Self-pay | Admitting: Neurology

## 2020-06-08 ENCOUNTER — Other Ambulatory Visit: Payer: Self-pay | Admitting: Family Medicine

## 2020-06-08 DIAGNOSIS — R202 Paresthesia of skin: Secondary | ICD-10-CM

## 2020-06-28 ENCOUNTER — Encounter: Payer: Self-pay | Admitting: Family Medicine

## 2020-06-28 ENCOUNTER — Ambulatory Visit (INDEPENDENT_AMBULATORY_CARE_PROVIDER_SITE_OTHER): Payer: Managed Care, Other (non HMO) | Admitting: Family Medicine

## 2020-06-28 ENCOUNTER — Other Ambulatory Visit: Payer: Self-pay

## 2020-06-28 VITALS — BP 138/72 | HR 91 | Temp 97.9°F | Resp 16 | Ht 71.0 in | Wt 194.0 lb

## 2020-06-28 DIAGNOSIS — Z23 Encounter for immunization: Secondary | ICD-10-CM | POA: Diagnosis not present

## 2020-06-28 DIAGNOSIS — I10 Essential (primary) hypertension: Secondary | ICD-10-CM | POA: Diagnosis not present

## 2020-06-28 DIAGNOSIS — D582 Other hemoglobinopathies: Secondary | ICD-10-CM | POA: Diagnosis not present

## 2020-06-28 DIAGNOSIS — R202 Paresthesia of skin: Secondary | ICD-10-CM

## 2020-06-28 DIAGNOSIS — E291 Testicular hypofunction: Secondary | ICD-10-CM

## 2020-06-28 MED ORDER — LISINOPRIL 5 MG PO TABS: 5.0000 mg | ORAL_TABLET | Freq: Every day | ORAL | 3 refills | Status: DC

## 2020-06-28 NOTE — Patient Instructions (Signed)
Please return in 3 months to recheck blood pressure on medication. Please bring in your readings from home and your home blood pressure cuff.   Today you were given your flu vaccination.    If you have any questions or concerns, please don't hesitate to send me a message via MyChart or call the office at [phone]. Thank you for visiting with [place] today! It's our pleasure caring for you.   Hypertension, Adult High blood pressure (hypertension) is when the force of blood pumping through the arteries is too strong. The arteries are the blood vessels that carry blood from the heart throughout the body. Hypertension forces the heart to work harder to pump blood and may cause arteries to become narrow or stiff. Untreated or uncontrolled hypertension can cause a heart attack, heart failure, a stroke, kidney disease, and other problems. A blood pressure reading consists of a higher number over a lower number. Ideally, your blood pressure should be below 120/80. The first ("top") number is called the systolic pressure. It is a measure of the pressure in your arteries as your heart beats. The second ("bottom") number is called the diastolic pressure. It is a measure of the pressure in your arteries as the heart relaxes. What are the causes? The exact cause of this condition is not known. There are some conditions that result in or are related to high blood pressure. What increases the risk? Some risk factors for high blood pressure are under your control. The following factors may make you more likely to develop this condition:  Smoking.  Having type 2 diabetes mellitus, high cholesterol, or both.  Not getting enough exercise or physical activity.  Being overweight.  Having too much fat, sugar, calories, or salt (sodium) in your diet.  Drinking too much alcohol. Some risk factors for high blood pressure may be difficult or impossible to change. Some of these factors include:  Having chronic  kidney disease.  Having a family history of high blood pressure.  Age. Risk increases with age.  Race. You may be at higher risk if you are African American.  Gender. Men are at higher risk than women before age 23. After age 15, women are at higher risk than men.  Having obstructive sleep apnea.  Stress. What are the signs or symptoms? High blood pressure may not cause symptoms. Very high blood pressure (hypertensive crisis) may cause:  Headache.  Anxiety.  Shortness of breath.  Nosebleed.  Nausea and vomiting.  Vision changes.  Severe chest pain.  Seizures. How is this diagnosed? This condition is diagnosed by measuring your blood pressure while you are seated, with your arm resting on a flat surface, your legs uncrossed, and your feet flat on the floor. The cuff of the blood pressure monitor will be placed directly against the skin of your upper arm at the level of your heart. It should be measured at least twice using the same arm. Certain conditions can cause a difference in blood pressure between your right and left arms. Certain factors can cause blood pressure readings to be lower or higher than normal for a short period of time:  When your blood pressure is higher when you are in a health care provider's office than when you are at home, this is called white coat hypertension. Most people with this condition do not need medicines.  When your blood pressure is higher at home than when you are in a health care provider's office, this is called masked hypertension. Most  people with this condition may need medicines to control blood pressure. If you have a high blood pressure reading during one visit or you have normal blood pressure with other risk factors, you may be asked to:  Return on a different day to have your blood pressure checked again.  Monitor your blood pressure at home for 1 week or longer. If you are diagnosed with hypertension, you may have other blood  or imaging tests to help your health care provider understand your overall risk for other conditions. How is this treated? This condition is treated by making healthy lifestyle changes, such as eating healthy foods, exercising more, and reducing your alcohol intake. Your health care provider may prescribe medicine if lifestyle changes are not enough to get your blood pressure under control, and if:  Your systolic blood pressure is above 130.  Your diastolic blood pressure is above 80. Your personal target blood pressure may vary depending on your medical conditions, your age, and other factors. Follow these instructions at home: Eating and drinking   Eat a diet that is high in fiber and potassium, and low in sodium, added sugar, and fat. An example eating plan is called the DASH (Dietary Approaches to Stop Hypertension) diet. To eat this way: ? Eat plenty of fresh fruits and vegetables. Try to fill one half of your plate at each meal with fruits and vegetables. ? Eat whole grains, such as whole-wheat pasta, brown rice, or whole-grain bread. Fill about one fourth of your plate with whole grains. ? Eat or drink low-fat dairy products, such as skim milk or low-fat yogurt. ? Avoid fatty cuts of meat, processed or cured meats, and poultry with skin. Fill about one fourth of your plate with lean proteins, such as fish, chicken without skin, beans, eggs, or tofu. ? Avoid pre-made and processed foods. These tend to be higher in sodium, added sugar, and fat.  Reduce your daily sodium intake. Most people with hypertension should eat less than 1,500 mg of sodium a day.  Do not drink alcohol if: ? Your health care provider tells you not to drink. ? You are pregnant, may be pregnant, or are planning to become pregnant.  If you drink alcohol: ? Limit how much you use to:  0-1 drink a day for women.  0-2 drinks a day for men. ? Be aware of how much alcohol is in your drink. In the [place] one drink  equals one 12 oz bottle of beer (355 mL), one 5 oz glass of wine (148 mL), or one 1 oz glass of hard liquor (44 mL). Lifestyle   Work with your health care provider to maintain a healthy body weight or to lose weight. Ask what an ideal weight is for you.  Get at least 30 minutes of exercise most days of the week. Activities may include walking, swimming, or biking.  Include exercise to strengthen your muscles (resistance exercise), such as Pilates or lifting weights, as part of your weekly exercise routine. Try to do these types of exercises for 30 minutes at least 3 days a week.  Do not use any products that contain nicotine or tobacco, such as cigarettes, e-cigarettes, and chewing tobacco. If you need help quitting, ask your health care provider.  Monitor your blood pressure at home as told by your health care provider.  Keep all follow-up visits as told by your health care provider. This is important. Medicines  Take over-the-counter and prescription medicines only as told by your  health care provider. Follow directions carefully. Blood pressure medicines must be taken as prescribed.  Do not skip doses of blood pressure medicine. Doing this puts you at risk for problems and can make the medicine less effective.  Ask your health care provider about side effects or reactions to medicines that you should watch for. Contact a health care provider if you:  Think you are having a reaction to a medicine you are taking.  Have headaches that keep coming back (recurring).  Feel dizzy.  Have swelling in your ankles.  Have trouble with your vision. Get help right away if you:  Develop a severe headache or confusion.  Have unusual weakness or numbness.  Feel faint.  Have severe pain in your chest or abdomen.  Vomit repeatedly.  Have trouble breathing. Summary  Hypertension is when the force of blood pumping through your arteries is too strong. If this condition is not  controlled, it may put you at risk for serious complications.  Your personal target blood pressure may vary depending on your medical conditions, your age, and other factors. For most people, a normal blood pressure is less than 120/80.  Hypertension is treated with lifestyle changes, medicines, or a combination of both. Lifestyle changes include losing weight, eating a healthy, low-sodium diet, exercising more, and limiting alcohol. This information is not intended to replace advice given to you by your health care provider. Make sure you discuss any questions you have with your health care provider. Document Revised: 05/29/2018 Document Reviewed: 05/29/2018 Elsevier Patient Education  2020 [employer].

## 2020-06-28 NOTE — Progress Notes (Signed)
Subjective  CC:  Chief Complaint  Patient presents with  . Hypertension    month f/u     HPI: Frederick Martin is a 49 y.o. male who presents to the office today to address the problems listed above in the chief complaint.  Hypertension f/u: This is a follow-up visit for blood pressure.  He took several home readings and found blood pressures to be ranging from 140s - 150/80s-90s.it does seem clear that his blood pressure related to stressors.  He has a stressful living situation, undergoing separation and possibly divorce.  He has had several bifrontal headaches.  No associated neurologic symptoms.  He is open to starting medication.  Reviewed recent lab work together.  Paresthesias in his hand with mild essential tremor: This has significantly improved.  May be related to more anxiety.  Not having pain.  Did not get nerve conduction studies done.  Lab work was unremarkable.  Elevated hemoglobin in the setting of testosterone use for hypergonadism.  BP Readings from Last 3 Encounters:  06/28/20 138/72  05/18/20 (!) 150/90  06/30/19 125/77    Assessment    1. Essential hypertension   2. Male hypogonadism   3. Elevated hemoglobin (HCC)   4. Hand paresthesia, unspecified laterality   5. Need for immunization against influenza      Plan    Hypertension f/u: BP control is fairly well controlled.  We will start low-dose ACE inhibitor and monitor.  He will continue home readings.  Lisinopril 5 mg daily started.  Educated on possible side effect of cough.  The after visit summary handout for further information.  Elevated hemoglobin f/u: Side effect of testosterone.  Need to monitor.  Will copy his urologist on his labs.  Hand paresthesias and tremor: Improved and not bothersome.  No further evaluation at this time.  He will follow up here if they worsen. Education regarding management of these chronic disease states was given. Management strategies discussed on successive visits  include dietary and exercise recommendations, goals of achieving and maintaining IBW, and lifestyle modifications aiming for adequate sleep and minimizing stressors.   Follow up: Return in about 3 months (around 09/27/2020) for follow up Hypertension.  Orders Placed This Encounter  Procedures  . Flu Vaccine QUAD 36+ mos IM   Meds ordered this encounter  Medications  . lisinopril (ZESTRIL) 5 MG tablet    Sig: Take 1 tablet (5 mg total) by mouth daily.    Dispense:  90 tablet    Refill:  3      BP Readings from Last 3 Encounters:  06/28/20 138/72  05/18/20 (!) 150/90  06/30/19 125/77   Wt Readings from Last 3 Encounters:  06/28/20 194 lb (88 kg)  05/18/20 191 lb 3.2 oz (86.7 kg)  07/24/19 199 lb (90.3 kg)    Lab Results  Component Value Date   CHOL 141 05/18/2020   CHOL 154 04/23/2019   Lab Results  Component Value Date   HDL 50 05/18/2020   HDL 44.70 04/23/2019   Lab Results  Component Value Date   LDLCALC 76 05/18/2020   LDLCALC 84 04/23/2019   Lab Results  Component Value Date   TRIG 69 05/18/2020   TRIG 127.0 04/23/2019   Lab Results  Component Value Date   CHOLHDL 2.8 05/18/2020   CHOLHDL 3 04/23/2019   No results found for: LDLDIRECT Lab Results  Component Value Date   CREATININE 0.93 05/18/2020   BUN 12 05/18/2020   NA 136 05/18/2020  K 4.3 05/18/2020   CL 99 05/18/2020   CO2 29 05/18/2020    The 10-year ASCVD risk score Mikey Bussing DC Jr., et al., 2013) is: 2%   Values used to calculate the score:     Age: 20 years     Sex: Male     Is Non-Hispanic African American: No     Diabetic: No     Tobacco smoker: No     Systolic Blood Pressure: 308 mmHg     Is BP treated: Yes     HDL Cholesterol: 50 mg/dL     Total Cholesterol: 141 mg/dL  Lab Results  Component Value Date   TSH 1.42 05/18/2020   Lab Results  Component Value Date   WBC 7.6 05/18/2020   HGB 17.5 (H) 05/18/2020   HCT 52.1 (H) 05/18/2020   MCV 89.4 05/18/2020   PLT 292  05/18/2020    I reviewed the patients updated PMH, FH, and SocHx.    Patient Active Problem List   Diagnosis Date Noted  . Male hypogonadism 08/06/2019  . Family history of colon cancer in father 04/05/2018  . Gastroesophageal reflux disease 03/07/2017  . Mild intermittent asthma without complication 65/78/4696  . Erectile dysfunction 06/28/2016  . MVP (mitral valve prolapse) 03/21/2016  . Allergic rhinitis 07/24/2011    Allergies: Patient has no known allergies.  Social History: Patient  reports that he has never smoked. He has never used smokeless tobacco. He reports current alcohol use. He reports that he does not use drugs.  Current Meds  Medication Sig  . sildenafil (REVATIO) 20 MG tablet Take 1 to 5 tablets as needed  . testosterone cypionate (DEPOTESTOSTERONE CYPIONATE) 200 MG/ML injection Inject into the muscle every 14 (fourteen) days.    Review of Systems: Cardiovascular: negative for chest pain, palpitations, leg swelling, orthopnea Respiratory: negative for SOB, wheezing or persistent cough Gastrointestinal: negative for abdominal pain Genitourinary: negative for dysuria or gross hematuria  Objective  Vitals: BP 138/72   Pulse 91   Temp 97.9 F (36.6 C) (Temporal)   Resp 16   Ht 5\' 11"  (1.803 m)   Wt 194 lb (88 kg)   SpO2 97%   BMI 27.06 kg/m  General: no acute distress  Psych:  Alert and oriented, normal mood and affect Neurologic:   Mental status is normal  Commons side effects, risks, benefits, and alternatives for medications and treatment plan prescribed today were discussed, and the patient expressed understanding of the given instructions. Patient is instructed to call or message via MyChart if he/she has any questions or concerns regarding our treatment plan. No barriers to understanding were identified. We discussed Red Flag symptoms and signs in detail. Patient expressed understanding regarding what to do in case of urgent or emergency type  symptoms.   Medication list was reconciled, printed and provided to the patient in AVS. Patient instructions and summary information was reviewed with the patient as documented in the AVS. This note was prepared with assistance of Dragon voice recognition software. Occasional wrong-word or sound-a-like substitutions may have occurred due to the inherent limitations of voice recognition software  This visit occurred during the SARS-CoV-2 public health emergency.  Safety protocols were in place, including screening questions prior to the visit, additional usage of staff PPE, and extensive cleaning of exam room while observing appropriate contact time as indicated for disinfecting solutions.

## 2020-07-07 ENCOUNTER — Encounter: Payer: Managed Care, Other (non HMO) | Admitting: Neurology

## 2020-08-06 ENCOUNTER — Encounter: Payer: Self-pay | Admitting: Family Medicine

## 2020-08-10 ENCOUNTER — Other Ambulatory Visit: Payer: Self-pay

## 2020-08-10 MED ORDER — LISINOPRIL 5 MG PO TABS
5.0000 mg | ORAL_TABLET | Freq: Every day | ORAL | 3 refills | Status: DC
Start: 2020-08-10 — End: 2021-05-11

## 2020-09-27 ENCOUNTER — Other Ambulatory Visit: Payer: Self-pay

## 2020-09-27 ENCOUNTER — Encounter: Payer: Self-pay | Admitting: Family Medicine

## 2020-09-27 ENCOUNTER — Ambulatory Visit (INDEPENDENT_AMBULATORY_CARE_PROVIDER_SITE_OTHER): Payer: Managed Care, Other (non HMO) | Admitting: Family Medicine

## 2020-09-27 VITALS — BP 120/78 | HR 78 | Temp 98.3°F | Wt 195.6 lb

## 2020-09-27 DIAGNOSIS — I10 Essential (primary) hypertension: Secondary | ICD-10-CM

## 2020-09-27 NOTE — Progress Notes (Signed)
Subjective  CC:  Chief Complaint  Patient presents with  . Hypertension    HPI: Frederick Martin is a 49 y.o. male who presents to the office today to address the problems listed above in the chief complaint.  Hypertension f/u: we started lisinopril 5mg  last visit: BP is now controlled. Feels well w/o AEs or side effeccts.  Control is good . Pt reports he is doing well. taking medications as instructed, no medication side effects noted, no TIAs, no chest pain on exertion, no dyspnea on exertion, no swelling of ankles. He denies adverse effects from his BP medications. Compliance with medication is good.   Assessment  1. Essential hypertension      Plan    Hypertension f/u: BP control is well controlled. Continue low dose ACE.   Education regarding management of these chronic disease states was given. Management strategies discussed on successive visits include dietary and exercise recommendations, goals of achieving and maintaining IBW, and lifestyle modifications aiming for adequate sleep and minimizing stressors.   Follow up: august 2022 for cpe  No orders of the defined types were placed in this encounter.  No orders of the defined types were placed in this encounter.     BP Readings from Last 3 Encounters:  09/27/20 120/78  06/28/20 138/72  05/18/20 (!) 150/90   Wt Readings from Last 3 Encounters:  09/27/20 195 lb 9.6 oz (88.7 kg)  06/28/20 194 lb (88 kg)  05/18/20 191 lb 3.2 oz (86.7 kg)    Lab Results  Component Value Date   CHOL 141 05/18/2020   CHOL 154 04/23/2019   Lab Results  Component Value Date   HDL 50 05/18/2020   HDL 44.70 04/23/2019   Lab Results  Component Value Date   LDLCALC 76 05/18/2020   LDLCALC 84 04/23/2019   Lab Results  Component Value Date   TRIG 69 05/18/2020   TRIG 127.0 04/23/2019   Lab Results  Component Value Date   CHOLHDL 2.8 05/18/2020   CHOLHDL 3 04/23/2019   No results found for: LDLDIRECT Lab Results   Component Value Date   CREATININE 0.93 05/18/2020   BUN 12 05/18/2020   NA 136 05/18/2020   K 4.3 05/18/2020   CL 99 05/18/2020   CO2 29 05/18/2020    The 10-year ASCVD risk score 05/20/2020 DC Jr., et al., 2013) is: 1.5%   Values used to calculate the score:     Age: 79 years     Sex: Male     Is Non-Hispanic African American: No     Diabetic: No     Tobacco smoker: No     Systolic Blood Pressure: 120 mmHg     Is BP treated: Yes     HDL Cholesterol: 50 mg/dL     Total Cholesterol: 141 mg/dL  I reviewed the patients updated PMH, FH, and SocHx.    Patient Active Problem List   Diagnosis Date Noted  . Essential hypertension 09/27/2020    Priority: High  . Family history of colon cancer in father 04/05/2018    Priority: High  . Male hypogonadism 08/06/2019    Priority: Medium  . Gastroesophageal reflux disease 03/07/2017    Priority: Medium  . Mild intermittent asthma without complication 03/07/2017    Priority: Medium  . Erectile dysfunction 06/28/2016    Priority: Medium  . MVP (mitral valve prolapse) 03/21/2016    Priority: Low  . Allergic rhinitis 07/24/2011    Priority: Low  Allergies: Patient has no known allergies.  Social History: Patient  reports that he has never smoked. He has never used smokeless tobacco. He reports current alcohol use. He reports that he does not use drugs.  Current Meds  Medication Sig  . lisinopril (ZESTRIL) 5 MG tablet Take 1 tablet (5 mg total) by mouth daily.  . sildenafil (REVATIO) 20 MG tablet Take 1 to 5 tablets as needed  . testosterone cypionate (DEPOTESTOSTERONE CYPIONATE) 200 MG/ML injection Inject into the muscle every 14 (fourteen) days.    Review of Systems: Cardiovascular: negative for chest pain, palpitations, leg swelling, orthopnea Respiratory: negative for SOB, wheezing or persistent cough Gastrointestinal: negative for abdominal pain Genitourinary: negative for dysuria or gross hematuria  Objective  Vitals:  BP 120/78   Pulse 78   Temp 98.3 F (36.8 C) (Temporal)   Wt 195 lb 9.6 oz (88.7 kg)   SpO2 98%   BMI 27.28 kg/m  General: no acute distress    Commons side effects, risks, benefits, and alternatives for medications and treatment plan prescribed today were discussed, and the patient expressed understanding of the given instructions. Patient is instructed to call or message via MyChart if he/she has any questions or concerns regarding our treatment plan. No barriers to understanding were identified. We discussed Red Flag symptoms and signs in detail. Patient expressed understanding regarding what to do in case of urgent or emergency type symptoms.   Medication list was reconciled, printed and provided to the patient in AVS. Patient instructions and summary information was reviewed with the patient as documented in the AVS. This note was prepared with assistance of Dragon voice recognition software. Occasional wrong-word or sound-a-like substitutions may have occurred due to the inherent limitations of voice recognition software  This visit occurred during the SARS-CoV-2 public health emergency.  Safety protocols were in place, including screening questions prior to the visit, additional usage of staff PPE, and extensive cleaning of exam room while observing appropriate contact time as indicated for disinfecting solutions.

## 2020-09-27 NOTE — Patient Instructions (Signed)
Please return in august 2022 for your annual complete physical; please come fasting. You may let me know if you have any problems with elevated blood pressures prior.   Take care!  If you have any questions or concerns, please don't hesitate to send me a message via MyChart or call the office at [phone]. Thank you for visiting with [place] today! It's our pleasure caring for you.

## 2021-05-11 ENCOUNTER — Encounter: Payer: Managed Care, Other (non HMO) | Admitting: Family Medicine

## 2021-05-11 ENCOUNTER — Other Ambulatory Visit: Payer: Self-pay

## 2021-05-11 ENCOUNTER — Ambulatory Visit (INDEPENDENT_AMBULATORY_CARE_PROVIDER_SITE_OTHER): Payer: Managed Care, Other (non HMO) | Admitting: Family Medicine

## 2021-05-11 ENCOUNTER — Encounter: Payer: Self-pay | Admitting: Family Medicine

## 2021-05-11 VITALS — BP 132/80 | HR 73 | Temp 97.9°F | Ht 71.0 in | Wt 211.6 lb

## 2021-05-11 DIAGNOSIS — N529 Male erectile dysfunction, unspecified: Secondary | ICD-10-CM

## 2021-05-11 DIAGNOSIS — E291 Testicular hypofunction: Secondary | ICD-10-CM

## 2021-05-11 DIAGNOSIS — I10 Essential (primary) hypertension: Secondary | ICD-10-CM

## 2021-05-11 DIAGNOSIS — Z0000 Encounter for general adult medical examination without abnormal findings: Secondary | ICD-10-CM | POA: Diagnosis not present

## 2021-05-11 DIAGNOSIS — Z800 Family history of malignant neoplasm of digestive organs: Secondary | ICD-10-CM | POA: Diagnosis not present

## 2021-05-11 DIAGNOSIS — I341 Nonrheumatic mitral (valve) prolapse: Secondary | ICD-10-CM

## 2021-05-11 LAB — COMPREHENSIVE METABOLIC PANEL
ALT: 32 U/L (ref 0–53)
AST: 24 U/L (ref 0–37)
Albumin: 4.2 g/dL (ref 3.5–5.2)
Alkaline Phosphatase: 26 U/L — ABNORMAL LOW (ref 39–117)
BUN: 12 mg/dL (ref 6–23)
CO2: 28 mEq/L (ref 19–32)
Calcium: 9.3 mg/dL (ref 8.4–10.5)
Chloride: 100 mEq/L (ref 96–112)
Creatinine, Ser: 1.01 mg/dL (ref 0.40–1.50)
GFR: 88.15 mL/min (ref >60.00)
Glucose, Bld: 92 mg/dL (ref 70–99)
Potassium: 4.4 mEq/L (ref 3.5–5.1)
Sodium: 135 mEq/L (ref 135–145)
Total Bilirubin: 0.6 mg/dL (ref 0.2–1.2)
Total Protein: 7.1 g/dL (ref 6.0–8.3)

## 2021-05-11 LAB — LIPID PANEL
Cholesterol: 154 mg/dL (ref 0–200)
HDL: 51.1 mg/dL (ref >39.00)
LDL Cholesterol: 86 mg/dL (ref 0–99)
NonHDL: 102.7
Total CHOL/HDL Ratio: 3
Triglycerides: 83 mg/dL (ref 0.0–149.0)
VLDL: 16.6 mg/dL (ref 0.0–40.0)

## 2021-05-11 LAB — CBC WITH DIFFERENTIAL/PLATELET
Basophils Absolute: 0.1 10*3/uL (ref 0.0–0.1)
Basophils Relative: 1.2 % (ref 0.0–3.0)
Eosinophils Absolute: 0.7 10*3/uL (ref 0.0–0.7)
Eosinophils Relative: 10.1 % — ABNORMAL HIGH (ref 0.0–5.0)
HCT: 49.5 % (ref 39.0–52.0)
Hemoglobin: 16.5 g/dL (ref 13.0–17.0)
Lymphocytes Relative: 20.6 % (ref 12.0–46.0)
Lymphs Abs: 1.3 10*3/uL (ref 0.7–4.0)
MCHC: 33.3 g/dL (ref 30.0–36.0)
MCV: 87.2 fl (ref 78.0–100.0)
Monocytes Absolute: 0.8 10*3/uL (ref 0.1–1.0)
Monocytes Relative: 11.8 % (ref 3.0–12.0)
Neutro Abs: 3.7 10*3/uL (ref 1.4–7.7)
Neutrophils Relative %: 56.3 % (ref 43.0–77.0)
Platelets: 245 10*3/uL (ref 150.0–400.0)
RBC: 5.68 Mil/uL (ref 4.22–5.81)
RDW: 13.5 % (ref 11.5–15.5)
WBC: 6.5 10*3/uL (ref 4.0–10.5)

## 2021-05-11 MED ORDER — LISINOPRIL 5 MG PO TABS: 5.0000 mg | ORAL_TABLET | Freq: Every day | ORAL | 3 refills | Status: DC

## 2021-05-11 NOTE — Patient Instructions (Signed)
Please return in 6 months for hypertension follow up.   I will release your lab results to you on your MyChart account with further instructions. Please reply with any questions.    If you have any questions or concerns, please don't hesitate to send me a message via MyChart or call the office at [phone]. Thank you for visiting with [place] today! It's our pleasure caring for you.

## 2021-05-11 NOTE — Progress Notes (Signed)
Subjective  Chief Complaint  Patient presents with   Annual Exam    Fasting     HPI: Frederick Martin is a 50 y.o. male who presents to [facility] today for a Male Wellness Visit. He also has the concerns and/or needs as listed above in the chief complaint. These will be addressed in addition to the Health Maintenance Visit.   Wellness Visit: annual visit with health maintenance review and exam   Health maintenance: Doing well.  Legally separated, living on his own.  Feels good about this.  He has gained a little bit of weight and is now working on weight loss with walking for exercise.  Colon cancer screen is up-to-date.  Immunizations are up-to-date.  No mood concerns.  Of note, his best friend passed away this morning under hospice care.  He is appropriately grieving.  Body mass index is 29.51 kg/m. Wt Readings from Last 3 Encounters:  05/11/21 211 lb 9.6 oz (96 kg)  09/27/20 195 lb 9.6 oz (88.7 kg)  06/28/20 194 lb (88 kg)     Chronic disease management visit and/or acute problem visit: Hypertension: Remains well controlled.  Takes lisinopril 5 mg daily.  No adverse effects.  No chest pain or shortness of breath. Hypotestosteronism and ED: Managed by urology.  He is due for visit.  He does need refills.  Hemoglobin was elevated last year.   Patient Active Problem List   Diagnosis Date Noted   Essential hypertension 09/27/2020   Family history of colon cancer in father 04/05/2018   Male hypogonadism 08/06/2019   Gastroesophageal reflux disease 03/07/2017   Mild intermittent asthma without complication [id number]   Erectile dysfunction 06/28/2016   MVP (mitral valve prolapse) 03/21/2016   Allergic rhinitis 07/24/2011   Health Maintenance  Topic Date Due   TETANUS/TDAP  04/01/2021   INFLUENZA VACCINE  05/02/2021   Pneumococcal Vaccine 49-49 Years old (2 - PCV) 05/11/2022 (Originally 04/06/2019)   COLONOSCOPY (Pts 45-61yr Insurance coverage will  need to be confirmed)  06/29/2024   COVID-19 Vaccine  Completed   Hepatitis C Screening  Completed   HIV Screening  Completed   HPV VACCINES  Aged Out   Immunization History  Administered Date(s) Administered   Influenza Split 07/11/2013   Influenza, Quadrivalent, Recombinant, Inj, Pf 08/13/2016   Influenza,inj,Quad PF,6+ Mos 11/09/2018, 06/28/2020   Influenza,inj,quad, With Preservative 07/28/2017   Moderna SARS-COV2 Booster Vaccination 10/15/2020   PFIZER(Purple Top)SARS-COV-2 Vaccination 12/07/2019, 01/07/2020   Pneumococcal Polysaccharide-23 04/05/2018   Tdap 04/02/2011   We updated and reviewed the patient's past history in detail and it is documented below. Allergies: Patient has No Known Allergies. Past Medical History  has a past medical history of Asthma, GERD (gastroesophageal reflux disease), Heart murmur, and MVP (mitral valve prolapse). Past Surgical History Patient  has a past surgical history that includes Wisdom tooth extraction (2001). Social History Patient  reports that he has never smoked. He has never used smokeless tobacco. He reports current alcohol use. He reports that he does not use drugs. Family History family history includes Cancer in his maternal grandmother; Colon cancer in his father and paternal grandfather; Depression in his father and mother; Diabetes in his father; Esophageal cancer in his maternal grandfather; Heart disease in his father; Hyperlipidemia in his father; Hypertension in his father. Review of Systems: Constitutional: negative for fever or malaise Ophthalmic: negative for photophobia, double vision or loss of vision Cardiovascular: negative for chest pain, dyspnea on exertion, or  new LE swelling Respiratory: negative for SOB or persistent cough Gastrointestinal: negative for abdominal pain, change in bowel habits or melena Genitourinary: negative for dysuria or gross hematuria Musculoskeletal: negative for new gait disturbance or  muscular weakness Integumentary: negative for new or persistent rashes Neurological: negative for TIA or stroke symptoms Psychiatric: negative for SI or delusions Allergic/Immunologic: negative for hives  Patient Care Team    Relationship Specialty Notifications Start End  Leamon Arnt, MD PCP - General Family Medicine  05/18/20   Thornton Park, MD Consulting Physician Gastroenterology  05/18/20   Butch Penny, MD Referring Physician Urology  05/18/20    Objective  Vitals: BP 132/80   Pulse 73   Temp 97.9 F (36.6 C) (Temporal)   Ht '5\' 11"'$  (1.803 m)   Wt 211 lb 9.6 oz (96 kg)   SpO2 96%   BMI 29.51 kg/m  General:  Well developed, well nourished, no acute distress  Psych:  Alert and orientedx3,normal mood and affect HEENT:  Normocephalic, atraumatic, non-icteric sclera, PERRL, oropharynx is clear without mass or exudate, supple neck without adenopathy, mass or thyromegaly Cardiovascular:  Normal S1, S2, RRR without gallop, rub or murmur, nondisplaced PMI, +2 distal pulses in bilateral upper and lower extremities. Respiratory:  Good breath sounds bilaterally, CTAB with normal respiratory effort Gastrointestinal: normal bowel sounds, soft, non-tender, no noted masses. No HSM MSK: no deformities, contusions. Joints are without erythema or swelling. Spine and CVA region are nontender Skin:  Warm, no rashes or suspicious lesions noted Neurologic:    Mental status is normal.Gross motor and sensory exams are normal. Stable gait. No tremor    Assessment  1. Annual physical exam   2. Essential hypertension   3. Family history of colon cancer in father   60. Male hypogonadism   5. Erectile dysfunction, unspecified erectile dysfunction type   6. MVP (mitral valve prolapse)      Plan  Male Wellness Visit: Age appropriate Health Maintenance and Prevention measures were discussed with patient. Included topics are cancer screening recommendations, ways to keep healthy (see AVS)  including dietary and exercise recommendations, regular eye and dental care, use of seat belts, and avoidance of moderate alcohol use and tobacco use.  BMI: discussed patient's BMI and encouraged positive lifestyle modifications to help get to or maintain a target BMI. HM needs and immunizations were addressed and ordered. See below for orders. See HM and immunization section for updates. Routine labs and screening tests ordered including cmp, cbc and lipids where appropriate. Discussed recommendations regarding Vit D and calcium supplementation (see AVS)  Chronic disease f/u and/or acute problem visit: (deemed necessary to be done in addition to the wellness visit): Hypertension is controlled.  Continue lisinopril 5 mg daily.  Recheck 6 months.  If remains stable, can see annually.  Recheck renal function electrolytes. Monitor CBC on testosterone replacement.  Follow up: 6 months for hypertension follow-up [facility] side effects, risks, benefits, and alternatives for medications and treatment plan prescribed today were discussed, and the patient expressed understanding of the given instructions. Patient is instructed to call or message via MyChart if he/she has any questions or concerns regarding our treatment plan. No barriers to understanding were identified. We discussed Red Flag symptoms and signs in detail. Patient expressed understanding regarding what to do in case of urgent or emergency type symptoms.  Medication list was reconciled, printed and provided to the patient in AVS. Patient instructions and summary information was reviewed with the patient as documented in the  AVS. This note was prepared with assistance of Dragon voice recognition software. Occasional wrong-word or sound-a-like substitutions may have occurred due to the inherent limitations of voice recognition software  This visit occurred during the SARS-CoV-2 public health emergency.  Safety protocols were in place, including  screening questions prior to the visit, additional usage of staff PPE, and extensive cleaning of exam room while observing appropriate contact time as indicated for disinfecting solutions.   Orders Placed This Encounter  Procedures   CBC with Differential/Platelet   Comprehensive metabolic panel   Lipid panel   Meds ordered this encounter  Medications   lisinopril (ZESTRIL) 5 MG tablet    Sig: Take 1 tablet (5 mg total) by mouth daily.    Dispense:  90 tablet    Refill:  3

## 2021-05-18 ENCOUNTER — Encounter: Payer: Self-pay | Admitting: Family Medicine

## 2021-05-23 MED ORDER — EPINEPHRINE 0.3 MG/0.3ML IJ SOAJ: 0.3000 mg | INTRAMUSCULAR | 1 refills | Status: DC | PRN

## 2021-08-15 ENCOUNTER — Emergency Department (HOSPITAL_BASED_OUTPATIENT_CLINIC_OR_DEPARTMENT_OTHER): Payer: Managed Care, Other (non HMO)

## 2021-08-15 ENCOUNTER — Other Ambulatory Visit: Payer: Self-pay | Admitting: Family Medicine

## 2021-08-15 ENCOUNTER — Emergency Department (HOSPITAL_BASED_OUTPATIENT_CLINIC_OR_DEPARTMENT_OTHER)
Admission: EM | Admit: 2021-08-15 | Discharge: 2021-08-15 | Disposition: A | Discharge status: Home / Self Care | Payer: Managed Care, Other (non HMO) | Attending: Emergency Medicine | Admitting: Emergency Medicine

## 2021-08-15 ENCOUNTER — Encounter (HOSPITAL_BASED_OUTPATIENT_CLINIC_OR_DEPARTMENT_OTHER): Payer: Self-pay | Admitting: Urology

## 2021-08-15 ENCOUNTER — Other Ambulatory Visit: Payer: Self-pay

## 2021-08-15 DIAGNOSIS — Z79899 Other long term (current) drug therapy: Secondary | ICD-10-CM | POA: Diagnosis not present

## 2021-08-15 DIAGNOSIS — J4520 Mild intermittent asthma, uncomplicated: Secondary | ICD-10-CM | POA: Diagnosis not present

## 2021-08-15 DIAGNOSIS — I10 Essential (primary) hypertension: Secondary | ICD-10-CM | POA: Insufficient documentation

## 2021-08-15 DIAGNOSIS — R0609 Other forms of dyspnea: Secondary | ICD-10-CM | POA: Diagnosis present

## 2021-08-15 DIAGNOSIS — R0789 Other chest pain: Secondary | ICD-10-CM | POA: Diagnosis not present

## 2021-08-15 HISTORY — DX: Essential (primary) hypertension: I10

## 2021-08-15 LAB — BASIC METABOLIC PANEL
Anion gap: 6 (ref 5–15)
BUN: 11 mg/dL (ref 6–20)
CO2: 28 mmol/L (ref 22–32)
Calcium: 8.8 mg/dL — ABNORMAL LOW (ref 8.9–10.3)
Chloride: 104 mmol/L (ref 98–111)
Creatinine, Ser: 0.99 mg/dL (ref 0.61–1.24)
GFR, Estimated: >60 mL/min (ref >60)
Glucose, Bld: 84 mg/dL (ref 70–99)
Potassium: 3.7 mmol/L (ref 3.5–5.1)
Sodium: 138 mmol/L (ref 135–145)

## 2021-08-15 LAB — CBC
HCT: 46.2 % (ref 39.0–52.0)
Hemoglobin: 15.2 g/dL (ref 13.0–17.0)
MCH: 28.5 pg (ref 26.0–34.0)
MCHC: 32.9 g/dL (ref 30.0–36.0)
MCV: 86.7 fL (ref 80.0–100.0)
Platelets: 283 10*3/uL (ref 150–400)
RBC: 5.33 MIL/uL (ref 4.22–5.81)
RDW: 13.7 % (ref 11.5–15.5)
WBC: 7.5 10*3/uL (ref 4.0–10.5)
nRBC: 0 % (ref 0.0–0.2)

## 2021-08-15 LAB — TROPONIN I (HIGH SENSITIVITY)
Troponin I (High Sensitivity): 5 ng/L (ref <18)
Troponin I (High Sensitivity): 7 ng/L (ref <18)

## 2021-08-15 MED ORDER — ALBUTEROL SULFATE HFA 108 (90 BASE) MCG/ACT IN AERS
4.0000 | INHALATION_SPRAY | Freq: Once | RESPIRATORY_TRACT | Status: AC
  Administered 2021-08-15: 4 via RESPIRATORY_TRACT
  Filled 2021-08-15: qty 6.7

## 2021-08-15 MED ORDER — IOHEXOL 350 MG/ML SOLN
100.0000 mL | Freq: Once | INTRAVENOUS | Status: AC | PRN
  Administered 2021-08-15: 100 mL via INTRAVENOUS

## 2021-08-15 NOTE — ED Provider Notes (Signed)
[facility] Provider Note   CSN: [id number] Arrival date & time: 08/15/21  1726     History Chief Complaint  Patient presents with   Chest Pain    Frederick Martin is a 50 y.o. male.   Chest Pain Associated symptoms: shortness of breath (Exertional)   Associated symptoms: no abdominal pain, no back pain, no cough, no diaphoresis, no dizziness, no fatigue, no fever, no nausea, no numbness, no palpitations, no vomiting and no weakness   Patient presents for chest tightness over the past 2 weeks and exertional dyspnea.  He has had a lot of stress recently.  Both of his parents suffered fractured femurs.  A good friend of his suffered a fractured hip.  He has been taking care of all 3.  He has noticed chest tightness that has been intermittent over the past 2 weeks.  He does have a history of asthma.  He reports that he does not take any daily medications for his asthma.  He used to have an inhaler but no longer has one due to not having needed it in years.  He has been diagnosed with a mitral valve prolapse in the past.  He does follow-up with a primary care doctor.  His exertional shortness of breath has been noticed with walking and even with talking on the phone.  Currently, he states that his breathing feels normal to him.  He has not had any other symptoms, including any recent cough, fevers, chills.    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   Family history of colon cancer in father 04/05/2018   Gastroesophageal reflux disease 03/07/2017   Mild intermittent asthma without complication 88/50/2774   Erectile dysfunction 06/28/2016   MVP (mitral valve prolapse) 03/21/2016   Allergic rhinitis 07/24/2011    Past  Surgical History:  Procedure Laterality Date   WISDOM TOOTH EXTRACTION  2001       Family History  Problem Relation Age of Onset   Depression Mother    Depression Father    Diabetes Father    Heart disease Father    Hyperlipidemia Father    Hypertension Father    Colon cancer Father    Cancer Maternal Grandmother    Colon cancer Paternal Grandfather    Esophageal cancer Maternal Grandfather    Stomach cancer Neg Hx    Rectal cancer Neg Hx     Social History   Tobacco Use   Smoking status: Never   Smokeless tobacco: Never  Vaping Use   Vaping Use: Never used  Substance Use Topics   Alcohol use: Yes   Drug use: Never    Home Medications Prior to Admission medications   Medication Sig Start Date End Date Taking? Authorizing Provider  EPINEPHrine 0.3 mg/0.3 mL IJ SOAJ injection Inject 0.3 mg into the muscle as needed for anaphylaxis. 05/23/21   Leamon Arnt, MD  lisinopril (ZESTRIL) 5 MG tablet TAKE 1 TABLET DAILY 08/15/21   Leamon Arnt, MD  sildenafil (REVATIO) 20 MG tablet Take 1 to 5 tablets as needed 05/28/19   [name], DO  testosterone cypionate (DEPOTESTOSTERONE CYPIONATE) 200 MG/ML injection Inject into the muscle every 14 (fourteen) days.  [provider]    Allergies    Patient has no known allergies.  Review of Systems   Review of Systems  Constitutional:  Negative for activity change, appetite change, chills, diaphoresis, fatigue and fever.  HENT:  Negative for congestion, ear pain and sore throat.   Eyes:  Negative for pain and visual disturbance.  Respiratory:  Positive for chest tightness and shortness of breath (Exertional). Negative for cough.   Cardiovascular:  Negative for chest pain and palpitations.  Gastrointestinal:  Negative for abdominal pain, diarrhea, nausea and vomiting.  Genitourinary:  Negative for dysuria, flank pain and hematuria.  Musculoskeletal:  Negative for arthralgias, back pain, myalgias and neck pain.   Skin:  Negative for color change and rash.  Neurological:  Negative for dizziness, seizures, syncope, weakness, light-headedness and numbness.  All other systems reviewed and are negative.  Physical Exam Updated Vital Signs BP (!) 147/88   Pulse 87   Temp 98.2 F (36.8 C) (Oral)   Resp (!) 24   Ht 5\' 11"  (1.803 m)   Wt 91.2 kg   SpO2 95%   BMI 28.03 kg/m   Physical Exam Vitals and nursing note reviewed.  Constitutional:      General: He is not in acute distress.    Appearance: He is well-developed and normal weight. He is not ill-appearing, toxic-appearing or diaphoretic.  HENT:     Head: Normocephalic and atraumatic.  Eyes:     Extraocular Movements: Extraocular movements intact.     Conjunctiva/sclera: Conjunctivae normal.  Neck:     Vascular: No JVD.  Cardiovascular:     Rate and Rhythm: Normal rate and regular rhythm.     Heart sounds: Murmur heard.  Pulmonary:     Effort: Pulmonary effort is normal. No respiratory distress.     Breath sounds: Examination of the left-lower field reveals wheezing. Wheezing present. No decreased breath sounds or rhonchi.  Chest:     Chest wall: No tenderness.  Abdominal:     Palpations: Abdomen is soft.     Tenderness: There is no abdominal tenderness.  Musculoskeletal:     Cervical back: Normal range of motion and neck supple.     Right lower leg: No edema.     Left lower leg: No edema.  Skin:    General: Skin is warm and dry.  Neurological:     General: No focal deficit present.     Mental Status: He is alert.     Cranial Nerves: No cranial nerve deficit.     Motor: No weakness.  Psychiatric:        Mood and Affect: Mood normal.        Behavior: Behavior normal.    ED Results / Procedures / Treatments   Labs (all labs ordered are listed, but only abnormal results are displayed) Labs Reviewed  BASIC METABOLIC PANEL - Abnormal; Notable for the following components:      Result Value   Calcium 8.8 (*)    All other  components within normal limits  CBC  TROPONIN I (HIGH SENSITIVITY)  TROPONIN I (HIGH SENSITIVITY)    EKG EKG Interpretation  Date/Time:  Monday August 15 2021 17:34:20 EST Ventricular Rate:  71 PR Interval:  142 QRS Duration: 86 QT Interval:  386 QTC Calculation: 419 R Axis:   42 Text Interpretation: Normal sinus rhythm Possible Left atrial enlargement Borderline ECG Confirmed by Godfrey Pick [phone]) on 08/15/2021 5:36:26 PM  Radiology DG Chest 2 View  Result Date:  08/15/2021 CLINICAL DATA:  Chest pain and chest tightness. EXAM: CHEST - 2 VIEW COMPARISON:  None. FINDINGS: The heart size and mediastinal contours are within normal limits. Both lungs are clear. The visualized skeletal structures are unremarkable. IMPRESSION: No active cardiopulmonary disease. Electronically Signed   By: Ronney Asters M.D.   On: 08/15/2021 17:54   CT Angio Chest PE W and/or Wo Contrast  Result Date: 08/15/2021 CLINICAL DATA:  Chest tightness and shortness of breath for 2 weeks. EXAM: CT ANGIOGRAPHY CHEST WITH CONTRAST TECHNIQUE: Multidetector CT imaging of the chest was performed using the standard protocol during bolus administration of intravenous contrast. Multiplanar CT image reconstructions and MIPs were obtained to evaluate the vascular anatomy. CONTRAST:  155mL OMNIPAQUE IOHEXOL 350 MG/ML SOLN COMPARISON:  PA and lateral chest earlier today. FINDINGS: Cardiovascular: There is mild cardiomegaly with a left chamber predominance. The pulmonary arteries are normal in caliber and free of thrombus through the segmental divisions with subsegmental arteries poorly evaluated due to breathing motion and bolus timing. The aorta and great vessels are normal in caliber and enhancement. There is no aortic dissection, plaques or hematoma. There is a small superior recess pericardial effusion. There is no significant venous distension or evidence of acute pulmonary edema. Mediastinum/Nodes: No enlarged mediastinal,  hilar, or axillary lymph nodes. Thyroid gland, trachea, and esophagus demonstrate no significant findings. Lungs/Pleura: There are symmetric bilateral trace pleural effusions. There is a rounded 1.2 cm partially pleural based opacity laterally in the superior segment of the left lower lobe which could represent a nodular infiltrate or true pulmonary nodule. There is mild elevation right hemidiaphragm. The lungs are otherwise clear. Upper Abdomen: No acute abnormality. Musculoskeletal: No chest wall abnormality. No acute or significant osseous findings. Mild thoracic spondylosis. Review of the MIP images confirms the above findings. IMPRESSION: 1. Mild cardiomegaly but no acute chest CTA findings. The pulmonary arteries are clear through the segmental divisions with suboptimal evaluation of the subsegmental arterial bed. 2. 1.2 cm nodule versus inflammatory nodule in the left lower lobe superior segment. PET-CT or three-month follow-up CT recommended. 3. Bilateral trace pleural effusions but no findings of acute pulmonary edema, no significant pulmonary venous ectasia Electronically Signed   By: Telford Nab M.D.   On: 08/15/2021 20:43    Procedures Procedures   Medications Ordered in ED Medications  albuterol (VENTOLIN HFA) 108 (90 Base) MCG/ACT inhaler 4 puff (4 puffs Inhalation Given 08/15/21 2034)  iohexol (OMNIPAQUE) 350 MG/ML injection 100 mL (100 mLs Intravenous Contrast Given 08/15/21 2021)    ED Course  I have reviewed the triage vital signs and the nursing notes.  Pertinent labs & imaging results that were available during my care of the patient were reviewed by me and considered in my medical decision making (see chart for details).    MDM Rules/Calculators/A&P                          Patient is a 50 year old male with history of HTN and MVP, presenting for 2 weeks of exertional shortness of breath.  He denies any associated cough, fevers, chills.  He does have a remote history of  asthma but does not have any other chronic lung conditions.  He is not currently followed by cardiologist.  Vital signs on arrival notable for moderate hypertension.  On exam, patient is well-appearing.  At rest, his breathing is even and unlabored.  SPO2 is normal on room air.  On lung auscultation, he  does have a slight expiratory wheeze in the left lower lung field.  Patient was given puffs of albuterol inhaler.  Additionally, CTA of chest was ordered.  CTA did not show any evidence of PE.  It did show mild cardiomegaly with trace bilateral pleural effusions.  I did perform a bedside echocardiogram which showed grossly normal cardiac function, no pericardial effusion.  There were B-lines present in bibasilar lung areas.  Patient symptoms are likely cardiogenic in etiology.  This could be his mitral valve prolapse becoming symptomatic.  Additionally, he may have recently undergone a slight decrease in his cardiac function.  He was advised to follow-up with a cardiologist.  Contact information for [facility] was provided.  He would benefit from good blood pressure control as well as further testing.  Given that he has no symptoms at rest, he is appropriate for discharge at this time.  He was encouraged to return for any worsening of his symptoms.  He was discharged in good condition.  Final Clinical Impression(s) / ED Diagnoses Final diagnoses:  Exertional dyspnea    Rx / DC Orders ED Discharge Orders     None        Godfrey Pick, MD 08/16/21 1337

## 2021-08-15 NOTE — ED Triage Notes (Signed)
Chest tightness for 2 weeks, states SOB with exertion.  Denies any radiation of pain

## 2021-08-16 ENCOUNTER — Telehealth: Payer: Self-pay

## 2021-08-16 NOTE — Telephone Encounter (Signed)
Please advise 

## 2021-08-16 NOTE — Telephone Encounter (Signed)
Message was sent as an Pharmacist, hospital. Pt is scheduled with Cardiology on 09/12/21.

## 2021-08-16 NOTE — Telephone Encounter (Signed)
Pt made MyChart appt to see Dr Jonni Sanger on 12/30 for high BP and chest tightness. I called pt and he stated that he did go to the ED for the concern. Pt stated that he was told to go straight to Cardiology. Pt currently does not have a cardiologist. He then stated that his insurance does not require a referral. I offered an appt this week for an ED F/U. Pt stated that he does not want to see Dr Jonni Sanger and only needs a Cardiologist.

## 2021-08-16 NOTE — Telephone Encounter (Signed)
fyi

## 2021-08-16 NOTE — Telephone Encounter (Signed)
Nurse Assessment Nurse: Hassell Done, RN, Melanie Date/Time Eilene Ghazi Time): 08/15/2021 4:52:30 PM Confirm and document reason for call. If symptomatic, describe symptoms. ---Caller states he is having chest pain and he has high blood pressure as well. Wonders if he needs to be seen. Has had chest pain for a week or 2 weeks.  Does the patient have any new or worsening symptoms? ---Yes Will a triage be completed? ---Yes Related visit to physician within the last 2 weeks? ---No Does the PT have any chronic conditions? (i.e. diabetes, asthma, this includes High risk factors for pregnancy, etc.) ---Yes List chronic conditions. ---high blood pressure Is this a behavioral health or substance abuse call? ---No  [1] Chest pain lasts > 5 minutes AND  [2] age > 75  Frederick Martin 08/15/2021 4:54:07 PM 08/15/2021 4:50:11 PM Send to Urgent Queue Priscille Kluver 08/15/2021 4:57:11 PM 911 Outcome Documentation Hassell Done, RN, Threasa Beards Reason: Caller said he was going to [facility] 08/15/2021 4:56:33 PM Call EMS 911 Now Yes Hassell Done, RN, [name]

## 2021-08-24 ENCOUNTER — Other Ambulatory Visit: Payer: Self-pay

## 2021-08-24 ENCOUNTER — Ambulatory Visit (INDEPENDENT_AMBULATORY_CARE_PROVIDER_SITE_OTHER): Payer: Managed Care, Other (non HMO) | Admitting: Cardiovascular Disease

## 2021-08-24 ENCOUNTER — Encounter: Payer: Self-pay | Admitting: Cardiovascular Disease

## 2021-08-24 VITALS — BP 128/84 | HR 72 | Ht 71.0 in | Wt 206.8 lb

## 2021-08-24 DIAGNOSIS — Z01818 Encounter for other preprocedural examination: Secondary | ICD-10-CM | POA: Diagnosis not present

## 2021-08-24 DIAGNOSIS — I340 Nonrheumatic mitral (valve) insufficiency: Secondary | ICD-10-CM

## 2021-08-24 DIAGNOSIS — I5032 Chronic diastolic (congestive) heart failure: Secondary | ICD-10-CM | POA: Diagnosis not present

## 2021-08-24 DIAGNOSIS — Z01812 Encounter for preprocedural laboratory examination: Secondary | ICD-10-CM

## 2021-08-24 NOTE — Patient Instructions (Addendum)
Medication Instructions:  No changes *If you need a refill on your cardiac medications before your next appointment, please call your pharmacy*  Testing/Procedures: Your physician has requested that you have an echocardiogram before your TEE and Cardiac cath. Echocardiography is a painless test that uses sound waves to create images of your heart. It provides your doctor with information about the size and shape of your heart and how well your heart's chambers and valves are working. You may receive an ultrasound enhancing agent through an IV if needed to better visualize your heart during the echo.This procedure takes approximately one hour. There are no restrictions for this procedure. This will take place at the [address].   Your physician has requested that you have a TEE. During a TEE, sound waves are used to create images of your heart. It provides your doctor with information about the size and shape of your heart and how well your heart's chambers and valves are working. In this test, a transducer is attached to the end of a flexible tube that's guided down your throat and into your esophagus (the tube leading from you mouth to your stomach) to get a more detailed image of your heart. You are not awake for the procedure. Please see the instruction sheet given to you today. For further information please visit [email or web address].  Your physician has requested that you have a cardiac catheterization. Cardiac catheterization is used to diagnose and/or treat various heart conditions. Doctors may recommend this procedure for a number of different reasons. The most common reason is to evaluate chest pain. Chest pain can be a symptom of coronary artery disease (CAD), and cardiac catheterization can show whether plaque is narrowing or blocking your heart's arteries. This procedure is also used to evaluate the valves, as well as measure the blood flow and oxygen levels in different parts  of your heart. For further information please visit [email or web address]. Please follow instruction sheet, as given.    Follow-Up: At [facility], you and your health needs are our priority.  As part of our continuing mission to provide you with exceptional heart care, we have created designated Provider Care Teams.  These Care Teams include your primary Cardiologist (physician) and Advanced Practice Providers (APPs -  Physician Assistants and Nurse Practitioners) who all work together to provide you with the care you need, when you need it.  We recommend signing up for the patient portal called "MyChart".  Sign up information is provided on this After Visit Summary.  MyChart is used to connect with patients for Virtual Visits (Telemedicine).  Patients are able to view lab/test results, encounter notes, upcoming appointments, etc.  Non-urgent messages can be sent to your provider as well.   To learn more about what you can do with MyChart, go to [email or web address].    Your next appointment:   We will call you to set up a follow up appointment    Other Instructions  You are scheduled for a TEE on 09/08/2021 with Dr. Johnsie Cancel.  Please arrive at the [facility] (Main Entrance A) at [facility]: [address] [place], [place] [address] at 8 am/pm. (1 hour prior to procedure unless lab work is needed)  DIET: Nothing to eat or drink after midnight except a sip of water with medications (see medication instructions below)  FYI: For your safety, and to allow [place] to monitor your vital signs accurately during the surgery/procedure we request that     Medication  Instructions: Hold: nothing to hold   Labs:   Your provider would like for you to return  12/1 or 12/2 to have the following labs drawn: CBC and BMET. You do not need an appointment for the lab. Once in our office lobby there is a podium where you can sign in and ring the doorbell to alert [place] that you are here. The lab is open  from 8:00 am to 4:30 pm; closed for lunch from 12:45pm-1:45pm.  You must have a responsible person to drive you home and stay in the waiting area during your procedure. Failure to do so could result in cancellation.  Bring your insurance cards.  *Special Note: Every effort is made to have your procedure done on time. Occasionally there are emergencies that occur at the hospital that may cause delays. Please be patient if a delay does occur.    [address] [place] [address] Dept: [phone] Loc: [place]  08/24/2021  You are scheduled for a Cardiac Catheterization on Thursday, December 8 with Dr. Lauree Chandler.  1. Please arrive at the [facility] (Main Entrance A) at [facility]: [address] [place], [place] [address]. This will take place after the TEE.   Special note: Every effort is made to have your procedure done on time. Please understand that emergencies sometimes delay scheduled procedures.  2. Diet: Do not eat solid foods after midnight.  The patient may have clear liquids until 5am upon the day of the procedure.  3. Labs: Completed for the TEE  4. Medication instructions in preparation for your procedure: Nothing to hold   On the morning of your procedure, take your Aspirin and any morning medicines NOT listed above.  You may use sips of water.  5. Plan for one night stay--bring personal belongings. 6. Bring a current list of your medications and current insurance cards. 7. You MUST have a responsible person to drive you home. 8. Someone MUST be with you the first 24 hours after you arrive home or your discharge will be delayed. 9. Please wear clothes that are easy to get on and off and wear slip-on shoes.  Thank you for allowing [place] to care for you!   -- [place] [facility]

## 2021-08-24 NOTE — Progress Notes (Signed)
Cardiology Office Note:    Date:  08/26/2021   ID:  [name], DOB 1971/07/21, MRN [id number]  PCP:  Leamon Arnt, MD   [facility] Providers Cardiologist:  Sanda Klein, MD     Referring MD: Leamon Arnt, MD   Chief Complaint  Patient presents with   Shortness of Breath   Chest Pain  Taivon Haroon is a 50 y.o. male who is being seen today for the evaluation of dyspnea and cheat pain at the request of Leamon Arnt, MD.   History of Present Illness:    Rome Echavarria is a 50 y.o. male with a hx of mitral valve prolapse who has developed worsening symptoms of shortness of breath and chest tightness over the last several months.  He has noticed that he becomes short of breath if he talks on his phone while he is walking, but does not have shortness of breath during activities of daily living.  His job is fairly sedentary and he does not exercise much, although he does try to go for walks.  He has also noted with some episodes of retrosternal chest tightness at rest which have concerned him.  He has not had any dizziness, palpitations, focal neurological events or syncope.  He denies lower extremity edema.  Although his systolic blood pressure today is pretty normal, he states that at home it is usually in the 140s-150s.  We looked over the images of his echocardiogram from July 2020 together.  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  The study was interpreted as showing mild to moderate mitral regurgitation, but I think that mitral regurgitation is severe.  Specifically, there appears to be pulmonary vein systolic flow reversal, in the setting of a highly eccentric anteriorly directed MR jet.  The left ventricle was not dilated (end-diastolic diameter 4.9 cm, end-systolic diameter 3.0 cm), LVEF was normal by visual assessment at 60 to 65% and longitudinal strain was normal  (-19%).  The left atrium was described as normal in size, but the dimensions are abnormal (end-systolic diameter 3.9 cm, biplane end-systolic volume index 30 mL/meter squared).  [name] has a history of GERD and a remote esophagoplasty, but he does not currently have any difficulty with swallowing food or medications.  Past Medical History:  Diagnosis Date   Asthma    GERD (gastroesophageal reflux disease)    Heart murmur    Hypertension    MVP (mitral valve prolapse)     Past Surgical History:  Procedure Laterality Date   WISDOM TOOTH EXTRACTION  2001    Current Medications: No outpatient medications have been marked as taking for the 08/24/21 encounter (Office Visit) with Sanda Klein, MD.     Allergies:   Patient has no known allergies.   Social History   Socioeconomic History   Marital status: Legally Separated    Spouse name: Not on file   Number of children: 3   Years of education: Not on file   Highest education level: Not on file  Occupational History   Occupation: Holiday representative  Tobacco Use   Smoking status: Never   Smokeless tobacco: Never  Vaping Use   Vaping Use: Never used  Substance and Sexual Activity   Alcohol use: Yes   Drug use: Never   Sexual activity: Yes  Other Topics Concern   Not on file  Social History Narrative   Not  on file   Social Determinants of Health   Financial Resource Strain: Not on file  Food Insecurity: Not on file  Transportation Needs: Not on file  Physical Activity: Not on file  Stress: Not on file  Social Connections: Not on file     Family History: The patient's family history includes Cancer in his maternal grandmother; Colon cancer in his father and paternal grandfather; Depression in his father and mother; Diabetes in his father; Esophageal cancer in his maternal grandfather; Heart disease in his father; Hyperlipidemia in his father; Hypertension in his father. There is no history of Stomach cancer or  Rectal cancer.  ROS:   Please see the history of present illness.     All other systems reviewed and are negative.  EKGs/Labs/Other Studies Reviewed:    The following studies were reviewed today: Echocardiogram 05/02/2019 1. The left ventricle has normal systolic function with an ejection  fraction of 60-65%. The cavity size was normal. Left ventricular diastolic  parameters were normal. LV end diastolic dimension 49 mm.   2. The right ventricle has normal systolic function. The cavity was  normal. There is no increase in right ventricular wall thickness.   3. Mild bileaflet mitral valve prolapse, posterior greater than anterior  leaflet.   4. The mitral valve is abnormal. Mitral valve regurgitation is mild to  moderate by color flow Doppler and occurs late in systole. The MR jet is  anteriorly-directed.   5. The aortic valve is tricuspid. Aortic valve regurgitation is trivial  by color flow Doppler. No stenosis of the aortic valve.   6. The aorta is normal in size and structure.   EKG:  EKG is  ordered today.  The ekg ordered today demonstrates normal sinus rhythm, left atrial enlargement, left ventricular hypertrophy with secondary repolarization abnormalities.  Recent Labs: 05/11/2021: ALT 32 08/15/2021: BUN 11; Creatinine, Ser 0.99; Hemoglobin 15.2; Platelets 283; Potassium 3.7; Sodium 138  Recent Lipid Panel    Component Value Date/Time   CHOL 154 05/11/2021 1026   TRIG 83.0 05/11/2021 1026   HDL 51.10 05/11/2021 1026   CHOLHDL 3 05/11/2021 1026   VLDL 16.6 05/11/2021 1026   LDLCALC 86 05/11/2021 1026   LDLCALC 76 05/18/2020 1034     Risk Assessment/Calculations:           Physical Exam:    VS:  BP 128/84   Pulse 72   Ht 5\' 11"  (1.803 m)   Wt 206 lb 12.8 oz (93.8 kg)   SpO2 99%   BMI 28.84 kg/m     Wt Readings from Last 3 Encounters:  08/24/21 206 lb 12.8 oz (93.8 kg)  08/15/21 201 lb (91.2 kg)  05/11/21 211 lb 9.6 oz (96 kg)     GEN: Overweight,  Well nourished, well developed in no acute distress HEENT: Normal NECK: No JVD; No carotid bruits LYMPHATICS: No lymphadenopathy CARDIAC: RRR, loud (3/6) holosystolic apical murmur that radiates both deep into the axilla as well as towards the base, even heard in the back, no diastolic murmurs, rubs, gallops RESPIRATORY:  Clear to auscultation without rales, wheezing or rhonchi  ABDOMEN: Soft, non-tender, non-distended MUSCULOSKELETAL:  No edema; No deformity  SKIN: Warm and dry NEUROLOGIC:  Alert and oriented x 3 PSYCHIATRIC:  Normal affect   ASSESSMENT:    1. Nonrheumatic mitral valve regurgitation   2. Chronic diastolic heart failure ([place])   3. Pre-procedure lab exam    PLAN:    In order of problems listed above:  MVP/MR: I think that [name] has severe mitral regurgitation and that this explains his shortness of breath.  His symptoms may have occurred due to worsening MR from chordal rupture or simply due to gradual left ventricular decompensation from longstanding regurgitation.  The first order of business is to repeat his transthoracic echocardiogram, but I think that ultimately he will need both a transesophageal echo and a right and left cardiac catheterization.  We discussed the fact that its highly likely that he will require mitral valve repair, evaluation reveals unexpected new anatomical findings, he should be a good candidate for surgical repair with good long-term prognosis if the procedure is successful.  Discussed the TEE and cath procedures in detail and we will try to schedule them on the same day. This procedure has been fully reviewed with the patient and written informed consent has been obtained. CHF: His symptoms of dyspnea on exertion are consistent with NYHA functional class II heart failure.  He does not have orthopnea or PND and no signs of hypervolemia on physical exam.  CHF is probably due exclusively to a more.  His systolic blood pressure is frequently little  high at home.  We will increase his lisinopril to 10 mg daily.  At this point there is no compelling reason to prescribe diuretics. Chest pain: This is mild and occurs at rest.  Pattern does not really suggest angina pectoris but is also not clearly consistent with an esophageal etiology.  Currently he does not have dysphagia.  He will undergo cardiac catheterization anyway.  Reevaluate once we fully clarify the extent of his cardiac problems.      Shared Decision Making/Informed Consent The risks [stroke (1 in 1000), death (1 in 1000), kidney failure [usually temporary] (1 in 500), bleeding (1 in 200), allergic reaction [possibly serious] (1 in 200)], benefits (diagnostic support and management of coronary artery disease) and alternatives of a cardiac catheterization were discussed in detail with Mr. Minotti and he is willing to proceed.   The risks [esophageal damage, perforation (1:10,000 risk), bleeding, pharyngeal hematoma as well as other potential complications associated with conscious sedation including aspiration, arrhythmia, respiratory failure and death], benefits (treatment guidance and diagnostic support) and alternatives of a transesophageal echocardiogram were discussed in detail with Mr. Lowder and he is willing to proceed.     Medication Adjustments/Labs and Tests Ordered: Current medicines are reviewed at length with the patient today.  Concerns regarding medicines are outlined above.  Orders Placed This Encounter  Procedures   Basic metabolic panel   CBC   EKG 12-Lead   ECHOCARDIOGRAM COMPLETE   No orders of the defined types were placed in this encounter.   Patient Instructions  Medication Instructions:  No changes *If you need a refill on your cardiac medications before your next appointment, please call your pharmacy*  Testing/Procedures: Your physician has requested that you have an echocardiogram before your TEE and Cardiac cath. Echocardiography is a painless test  that uses sound waves to create images of your heart. It provides your doctor with information about the size and shape of your heart and how well your heart's chambers and valves are working. You may receive an ultrasound enhancing agent through an IV if needed to better visualize your heart during the echo.This procedure takes approximately one hour. There are no restrictions for this procedure. This will take place at the [address].   Your physician has requested that you have a TEE. During a TEE, sound  waves are used to create images of your heart. It provides your doctor with information about the size and shape of your heart and how well your heart's chambers and valves are working. In this test, a transducer is attached to the end of a flexible tube that's guided down your throat and into your esophagus (the tube leading from you mouth to your stomach) to get a more detailed image of your heart. You are not awake for the procedure. Please see the instruction sheet given to you today. For further information please visit [email or web address].  Your physician has requested that you have a cardiac catheterization. Cardiac catheterization is used to diagnose and/or treat various heart conditions. Doctors may recommend this procedure for a number of different reasons. The most common reason is to evaluate chest pain. Chest pain can be a symptom of coronary artery disease (CAD), and cardiac catheterization can show whether plaque is narrowing or blocking your heart's arteries. This procedure is also used to evaluate the valves, as well as measure the blood flow and oxygen levels in different parts of your heart. For further information please visit [email or web address]. Please follow instruction sheet, as given.    Follow-Up: At [facility], you and your health needs are our priority.  As part of our continuing mission to provide you with exceptional heart care, we have created  designated Provider Care Teams.  These Care Teams include your primary Cardiologist (physician) and Advanced Practice Providers (APPs -  Physician Assistants and Nurse Practitioners) who all work together to provide you with the care you need, when you need it.  We recommend signing up for the patient portal called "MyChart".  Sign up information is provided on this After Visit Summary.  MyChart is used to connect with patients for Virtual Visits (Telemedicine).  Patients are able to view lab/test results, encounter notes, upcoming appointments, etc.  Non-urgent messages can be sent to your provider as well.   To learn more about what you can do with MyChart, go to [email or web address].    Your next appointment:   We will call you to set up a follow up appointment    Other Instructions  You are scheduled for a TEE on 09/08/2021 with Dr. Johnsie Cancel.  Please arrive at the [facility] (Main Entrance A) at [facility]: [address] [place], [place] [address] at 8 am/pm. (1 hour prior to procedure unless lab work is needed)  DIET: Nothing to eat or drink after midnight except a sip of water with medications (see medication instructions below)  FYI: For your safety, and to allow [place] to monitor your vital signs accurately during the surgery/procedure we request that     Medication Instructions: Hold: nothing to hold   Labs:   Your provider would like for you to return  12/1 or 12/2 to have the following labs drawn: CBC and BMET. You do not need an appointment for the lab. Once in our office lobby there is a podium where you can sign in and ring the doorbell to alert [place] that you are here. The lab is open from 8:00 am to 4:30 pm; closed for lunch from 12:45pm-1:45pm.  You must have a responsible person to drive you home and stay in the waiting area during your procedure. Failure to do so could result in cancellation.  Bring your insurance cards.  *Special Note: Every effort is made to  have your procedure done on time. Occasionally there are emergencies that occur at the hospital  that may cause delays. Please be patient if a delay does occur.    [facility] [place] [address] Dept: [phone] Loc: [place]  08/24/2021  You are scheduled for a Cardiac Catheterization on Thursday, December 8 with Dr. Lauree Chandler.  1. Please arrive at the [facility] (Main Entrance A) at [facility]: [address] [place], [place] [address]. This will take place after the TEE.   Special note: Every effort is made to have your procedure done on time. Please understand that emergencies sometimes delay scheduled procedures.  2. Diet: Do not eat solid foods after midnight.  The patient may have clear liquids until 5am upon the day of the procedure.  3. Labs: Completed for the TEE  4. Medication instructions in preparation for your procedure: Nothing to hold   On the morning of your procedure, take your Aspirin and any morning medicines NOT listed above.  You may use sips of water.  5. Plan for one night stay--bring personal belongings. 6. Bring a current list of your medications and current insurance cards. 7. You MUST have a responsible person to drive you home. 8. Someone MUST be with you the first 24 hours after you arrive home or your discharge will be delayed. 9. Please wear clothes that are easy to get on and off and wear slip-on shoes.  Thank you for allowing [place] to care for you!   -- [place] [facility]   Signed, Sanda Klein, MD  08/26/2021 12:19 PM    [facility]

## 2021-08-24 NOTE — H&P (View-Only) (Signed)
Cardiology Office Note:    Date:  08/26/2021   ID:  Frederick Martin, DOB 1970-10-17, MRN [id number]  PCP:  Leamon Arnt, MD   [facility] Providers Cardiologist:  Sanda Klein, MD     Referring MD: Leamon Arnt, MD   Chief Complaint  Patient presents with   Shortness of Breath   Chest Pain  Frederick Martin is a 50 y.o. male who is being seen today for the evaluation of dyspnea and cheat pain at the request of Leamon Arnt, MD.   History of Present Illness:    Frederick Martin is a 50 y.o. male with a hx of mitral valve prolapse who has developed worsening symptoms of shortness of breath and chest tightness over the last several months.  He has noticed that he becomes short of breath if he talks on his phone while he is walking, but does not have shortness of breath during activities of daily living.  His job is fairly sedentary and he does not exercise much, although he does try to go for walks.  He has also noted with some episodes of retrosternal chest tightness at rest which have concerned him.  He has not had any dizziness, palpitations, focal neurological events or syncope.  He denies lower extremity edema.  Although his systolic blood pressure today is pretty normal, he states that at home it is usually in the 140s-150s.  We looked over the images of his echocardiogram from July 2020 together.  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  The study was interpreted as showing mild to moderate mitral regurgitation, but I think that mitral regurgitation is severe.  Specifically, there appears to be pulmonary vein systolic flow reversal, in the setting of a highly eccentric anteriorly directed MR jet.  The left ventricle was not dilated (end-diastolic diameter 4.9 cm, end-systolic diameter 3.0 cm), LVEF was normal by visual assessment at 60 to 65% and longitudinal strain was normal  (-19%).  The left atrium was described as normal in size, but the dimensions are abnormal (end-systolic diameter 3.9 cm, biplane end-systolic volume index 30 mL/meter squared).  Frederick Martin has a history of GERD and a remote esophagoplasty, but he does not currently have any difficulty with swallowing food or medications.  Past Medical History:  Diagnosis Date   Asthma    GERD (gastroesophageal reflux disease)    Heart murmur    Hypertension    MVP (mitral valve prolapse)     Past Surgical History:  Procedure Laterality Date   WISDOM TOOTH EXTRACTION  2001    Current Medications: No outpatient medications have been marked as taking for the 08/24/21 encounter (Office Visit) with Sanda Klein, MD.     Allergies:   Patient has no known allergies.   Social History   Socioeconomic History   Marital status: Legally Separated    Spouse name: Not on file   Number of children: 3   Years of education: Not on file   Highest education level: Not on file  Occupational History   Occupation: Holiday representative  Tobacco Use   Smoking status: Never   Smokeless tobacco: Never  Vaping Use   Vaping Use: Never used  Substance and Sexual Activity   Alcohol use: Yes   Drug use: Never   Sexual activity: Yes  Other Topics Concern   Not on file  Social History Narrative   Not  on file   Social Determinants of Health   Financial Resource Strain: Not on file  Food Insecurity: Not on file  Transportation Needs: Not on file  Physical Activity: Not on file  Stress: Not on file  Social Connections: Not on file     Family History: The patient's family history includes Cancer in his maternal grandmother; Colon cancer in his father and paternal grandfather; Depression in his father and mother; Diabetes in his father; Esophageal cancer in his maternal grandfather; Heart disease in his father; Hyperlipidemia in his father; Hypertension in his father. There is no history of Stomach cancer or  Rectal cancer.  ROS:   Please see the history of present illness.     All other systems reviewed and are negative.  EKGs/Labs/Other Studies Reviewed:    The following studies were reviewed today: Echocardiogram 05/02/2019 1. The left ventricle has normal systolic function with an ejection  fraction of 60-65%. The cavity size was normal. Left ventricular diastolic  parameters were normal. LV end diastolic dimension 49 mm.   2. The right ventricle has normal systolic function. The cavity was  normal. There is no increase in right ventricular wall thickness.   3. Mild bileaflet mitral valve prolapse, posterior greater than anterior  leaflet.   4. The mitral valve is abnormal. Mitral valve regurgitation is mild to  moderate by color flow Doppler and occurs late in systole. The MR jet is  anteriorly-directed.   5. The aortic valve is tricuspid. Aortic valve regurgitation is trivial  by color flow Doppler. No stenosis of the aortic valve.   6. The aorta is normal in size and structure.   EKG:  EKG is  ordered today.  The ekg ordered today demonstrates normal sinus rhythm, left atrial enlargement, left ventricular hypertrophy with secondary repolarization abnormalities.  Recent Labs: 05/11/2021: ALT 32 08/15/2021: BUN 11; Creatinine, Ser 0.99; Hemoglobin 15.2; Platelets 283; Potassium 3.7; Sodium 138  Recent Lipid Panel    Component Value Date/Time   CHOL 154 05/11/2021 1026   TRIG 83.0 05/11/2021 1026   HDL 51.10 05/11/2021 1026   CHOLHDL 3 05/11/2021 1026   VLDL 16.6 05/11/2021 1026   LDLCALC 86 05/11/2021 1026   LDLCALC 76 05/18/2020 1034     Risk Assessment/Calculations:           Physical Exam:    VS:  BP 128/84   Pulse 72   Ht 5\' 11"  (1.803 m)   Wt 206 lb 12.8 oz (93.8 kg)   SpO2 99%   BMI 28.84 kg/m     Wt Readings from Last 3 Encounters:  08/24/21 206 lb 12.8 oz (93.8 kg)  08/15/21 201 lb (91.2 kg)  05/11/21 211 lb 9.6 oz (96 kg)     GEN: Overweight,  Well nourished, well developed in no acute distress HEENT: Normal NECK: No JVD; No carotid bruits LYMPHATICS: No lymphadenopathy CARDIAC: RRR, loud (3/6) holosystolic apical murmur that radiates both deep into the axilla as well as towards the base, even heard in the back, no diastolic murmurs, rubs, gallops RESPIRATORY:  Clear to auscultation without rales, wheezing or rhonchi  ABDOMEN: Soft, non-tender, non-distended MUSCULOSKELETAL:  No edema; No deformity  SKIN: Warm and dry NEUROLOGIC:  Alert and oriented x 3 PSYCHIATRIC:  Normal affect   ASSESSMENT:    1. Nonrheumatic mitral valve regurgitation   2. Chronic diastolic heart failure ([place])   3. Pre-procedure lab exam    PLAN:    In order of problems listed above:  MVP/MR: I think that Frederick Martin has severe mitral regurgitation and that this explains his shortness of breath.  His symptoms may have occurred due to worsening MR from chordal rupture or simply due to gradual left ventricular decompensation from longstanding regurgitation.  The first order of business is to repeat his transthoracic echocardiogram, but I think that ultimately he will need both a transesophageal echo and a right and left cardiac catheterization.  We discussed the fact that its highly likely that he will require mitral valve repair, evaluation reveals unexpected new anatomical findings, he should be a good candidate for surgical repair with good long-term prognosis if the procedure is successful.  Discussed the TEE and cath procedures in detail and we will try to schedule them on the same day. This procedure has been fully reviewed with the patient and written informed consent has been obtained. CHF: His symptoms of dyspnea on exertion are consistent with NYHA functional class II heart failure.  He does not have orthopnea or PND and no signs of hypervolemia on physical exam.  CHF is probably due exclusively to a more.  His systolic blood pressure is frequently little  high at home.  We will increase his lisinopril to 10 mg daily.  At this point there is no compelling reason to prescribe diuretics. Chest pain: This is mild and occurs at rest.  Pattern does not really suggest angina pectoris but is also not clearly consistent with an esophageal etiology.  Currently he does not have dysphagia.  He will undergo cardiac catheterization anyway.  Reevaluate once we fully clarify the extent of his cardiac problems.      Shared Decision Making/Informed Consent The risks [stroke (1 in 1000), death (1 in 1000), kidney failure [usually temporary] (1 in 500), bleeding (1 in 200), allergic reaction [possibly serious] (1 in 200)], benefits (diagnostic support and management of coronary artery disease) and alternatives of a cardiac catheterization were discussed in detail with Mr. Siverson and he is willing to proceed.   The risks [esophageal damage, perforation (1:10,000 risk), bleeding, pharyngeal hematoma as well as other potential complications associated with conscious sedation including aspiration, arrhythmia, respiratory failure and death], benefits (treatment guidance and diagnostic support) and alternatives of a transesophageal echocardiogram were discussed in detail with Mr. Hofman and he is willing to proceed.     Medication Adjustments/Labs and Tests Ordered: Current medicines are reviewed at length with the patient today.  Concerns regarding medicines are outlined above.  Orders Placed This Encounter  Procedures   Basic metabolic panel   CBC   EKG 12-Lead   ECHOCARDIOGRAM COMPLETE   No orders of the defined types were placed in this encounter.   Patient Instructions  Medication Instructions:  No changes *If you need a refill on your cardiac medications before your next appointment, please call your pharmacy*  Testing/Procedures: Your physician has requested that you have an echocardiogram before your TEE and Cardiac cath. Echocardiography is a painless test  that uses sound waves to create images of your heart. It provides your doctor with information about the size and shape of your heart and how well your heart's chambers and valves are working. You may receive an ultrasound enhancing agent through an IV if needed to better visualize your heart during the echo.This procedure takes approximately one hour. There are no restrictions for this procedure. This will take place at the [address].   Your physician has requested that you have a TEE. During a TEE, sound  waves are used to create images of your heart. It provides your doctor with information about the size and shape of your heart and how well your heart's chambers and valves are working. In this test, a transducer is attached to the end of a flexible tube that's guided down your throat and into your esophagus (the tube leading from you mouth to your stomach) to get a more detailed image of your heart. You are not awake for the procedure. Please see the instruction sheet given to you today. For further information please visit [email or web address].  Your physician has requested that you have a cardiac catheterization. Cardiac catheterization is used to diagnose and/or treat various heart conditions. Doctors may recommend this procedure for a number of different reasons. The most common reason is to evaluate chest pain. Chest pain can be a symptom of coronary artery disease (CAD), and cardiac catheterization can show whether plaque is narrowing or blocking your heart's arteries. This procedure is also used to evaluate the valves, as well as measure the blood flow and oxygen levels in different parts of your heart. For further information please visit [email or web address]. Please follow instruction sheet, as given.    Follow-Up: At [facility], you and your health needs are our priority.  As part of our continuing mission to provide you with exceptional heart care, we have created  designated Provider Care Teams.  These Care Teams include your primary Cardiologist (physician) and Advanced Practice Providers (APPs -  Physician Assistants and Nurse Practitioners) who all work together to provide you with the care you need, when you need it.  We recommend signing up for the patient portal called "MyChart".  Sign up information is provided on this After Visit Summary.  MyChart is used to connect with patients for Virtual Visits (Telemedicine).  Patients are able to view lab/test results, encounter notes, upcoming appointments, etc.  Non-urgent messages can be sent to your provider as well.   To learn more about what you can do with MyChart, go to [email or web address].    Your next appointment:   We will call you to set up a follow up appointment    Other Instructions  You are scheduled for a TEE on 09/08/2021 with Dr. Johnsie Cancel.  Please arrive at the [facility] (Main Entrance A) at [facility]: [address] [place], [place] [address] at 8 am/pm. (1 hour prior to procedure unless lab work is needed)  DIET: Nothing to eat or drink after midnight except a sip of water with medications (see medication instructions below)  FYI: For your safety, and to allow [place] to monitor your vital signs accurately during the surgery/procedure we request that     Medication Instructions: Hold: nothing to hold   Labs:   Your provider would like for you to return  12/1 or 12/2 to have the following labs drawn: CBC and BMET. You do not need an appointment for the lab. Once in our office lobby there is a podium where you can sign in and ring the doorbell to alert [place] that you are here. The lab is open from 8:00 am to 4:30 pm; closed for lunch from 12:45pm-1:45pm.  You must have a responsible person to drive you home and stay in the waiting area during your procedure. Failure to do so could result in cancellation.  Bring your insurance cards.  *Special Note: Every effort is made to  have your procedure done on time. Occasionally there are emergencies that occur at the hospital  that may cause delays. Please be patient if a delay does occur.    [place] [address] Dept: [phone] Loc: [place]  08/24/2021  You are scheduled for a Cardiac Catheterization on Thursday, December 8 with Dr. Lauree Chandler.  1. Please arrive at the [facility] (Main Entrance A) at [facility]: [address] [place], [place] [address]. This will take place after the TEE.   Special note: Every effort is made to have your procedure done on time. Please understand that emergencies sometimes delay scheduled procedures.  2. Diet: Do not eat solid foods after midnight.  The patient may have clear liquids until 5am upon the day of the procedure.  3. Labs: Completed for the TEE  4. Medication instructions in preparation for your procedure: Nothing to hold   On the morning of your procedure, take your Aspirin and any morning medicines NOT listed above.  You may use sips of water.  5. Plan for one night stay--bring personal belongings. 6. Bring a current list of your medications and current insurance cards. 7. You MUST have a responsible person to drive you home. 8. Someone MUST be with you the first 24 hours after you arrive home or your discharge will be delayed. 9. Please wear clothes that are easy to get on and off and wear slip-on shoes.  Thank you for allowing [place] to care for you!   -- [facility]   Signed, Sanda Klein, MD  08/26/2021 12:19 PM    [place]

## 2021-08-26 ENCOUNTER — Encounter: Payer: Self-pay | Admitting: Cardiovascular Disease

## 2021-08-29 ENCOUNTER — Other Ambulatory Visit: Payer: Self-pay

## 2021-08-29 ENCOUNTER — Ambulatory Visit (HOSPITAL_COMMUNITY): Payer: Managed Care, Other (non HMO) | Attending: Cardiology

## 2021-08-29 ENCOUNTER — Encounter (HOSPITAL_COMMUNITY): Payer: Self-pay | Admitting: Cardiovascular Disease

## 2021-08-29 DIAGNOSIS — Z01812 Encounter for preprocedural laboratory examination: Secondary | ICD-10-CM

## 2021-08-29 DIAGNOSIS — I340 Nonrheumatic mitral (valve) insufficiency: Secondary | ICD-10-CM | POA: Diagnosis not present

## 2021-08-29 LAB — ECHOCARDIOGRAM COMPLETE
Area-P 1/2: 3.54 cm2
S' Lateral: 3.1 cm

## 2021-08-31 ENCOUNTER — Encounter: Payer: Self-pay | Admitting: Cardiovascular Disease

## 2021-09-06 ENCOUNTER — Other Ambulatory Visit: Payer: Self-pay

## 2021-09-06 ENCOUNTER — Encounter (HOSPITAL_BASED_OUTPATIENT_CLINIC_OR_DEPARTMENT_OTHER): Payer: Self-pay | Admitting: *Deleted

## 2021-09-06 ENCOUNTER — Encounter: Payer: Self-pay | Admitting: *Deleted

## 2021-09-06 ENCOUNTER — Emergency Department (HOSPITAL_BASED_OUTPATIENT_CLINIC_OR_DEPARTMENT_OTHER)
Admission: EM | Admit: 2021-09-06 | Discharge: 2021-09-06 | Disposition: A | Discharge status: Home / Self Care | Payer: Managed Care, Other (non HMO) | Attending: Emergency Medicine | Admitting: Emergency Medicine

## 2021-09-06 DIAGNOSIS — Z01812 Encounter for preprocedural laboratory examination: Secondary | ICD-10-CM | POA: Insufficient documentation

## 2021-09-06 DIAGNOSIS — Z7982 Long term (current) use of aspirin: Secondary | ICD-10-CM | POA: Insufficient documentation

## 2021-09-06 DIAGNOSIS — Z79899 Other long term (current) drug therapy: Secondary | ICD-10-CM | POA: Diagnosis not present

## 2021-09-06 DIAGNOSIS — J4520 Mild intermittent asthma, uncomplicated: Secondary | ICD-10-CM | POA: Diagnosis not present

## 2021-09-06 DIAGNOSIS — I10 Essential (primary) hypertension: Secondary | ICD-10-CM | POA: Diagnosis not present

## 2021-09-06 LAB — CBC WITH DIFFERENTIAL/PLATELET
Abs Immature Granulocytes: 0.06 10*3/uL (ref 0.00–0.07)
Basophils Absolute: 0.1 10*3/uL (ref 0.0–0.1)
Basophils Relative: 1 %
Eosinophils Absolute: 0.6 10*3/uL — ABNORMAL HIGH (ref 0.0–0.5)
Eosinophils Relative: 8 %
HCT: 46.3 % (ref 39.0–52.0)
Hemoglobin: 15.4 g/dL (ref 13.0–17.0)
Immature Granulocytes: 1 %
Lymphocytes Relative: 29 %
Lymphs Abs: 2.3 10*3/uL (ref 0.7–4.0)
MCH: 28.6 pg (ref 26.0–34.0)
MCHC: 33.3 g/dL (ref 30.0–36.0)
MCV: 86.1 fL (ref 80.0–100.0)
Monocytes Absolute: 0.9 10*3/uL (ref 0.1–1.0)
Monocytes Relative: 11 %
Neutro Abs: 4.1 10*3/uL (ref 1.7–7.7)
Neutrophils Relative %: 50 %
Platelets: 258 10*3/uL (ref 150–400)
RBC: 5.38 MIL/uL (ref 4.22–5.81)
RDW: 13.4 % (ref 11.5–15.5)
WBC: 8.1 10*3/uL (ref 4.0–10.5)
nRBC: 0 % (ref 0.0–0.2)

## 2021-09-06 LAB — BASIC METABOLIC PANEL
Anion gap: 8 (ref 5–15)
BUN: 15 mg/dL (ref 6–20)
CO2: 25 mmol/L (ref 22–32)
Calcium: 8.6 mg/dL — ABNORMAL LOW (ref 8.9–10.3)
Chloride: 103 mmol/L (ref 98–111)
Creatinine, Ser: 0.85 mg/dL (ref 0.61–1.24)
GFR, Estimated: >60 mL/min (ref >60)
Glucose, Bld: 100 mg/dL — ABNORMAL HIGH (ref 70–99)
Potassium: 3.9 mmol/L (ref 3.5–5.1)
Sodium: 136 mmol/L (ref 135–145)

## 2021-09-06 NOTE — ED Provider Notes (Signed)
[place] EMERGENCY DEPARTMENT Provider Note   CSN: [id number] Arrival date & time: 09/06/21  1922     History Chief Complaint  Patient presents with   Labs Only    Frederick Martin is a 50 y.o. male.  HPI  Patient is a 50 year old male with a history of MVP, hypertension, who presents to the emergency department requesting screening labs.  Patient states that he has an upcoming TEE as well as heart cath scheduled for later this week.  He was [place] the [place] and was not able to obtain his scheduled preprocedure lab work and came to the emergency department for this.  Patient denies any physical complaints at this time.     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   Family history of colon cancer in father 04/05/2018   Gastroesophageal reflux disease 03/07/2017   Mild intermittent asthma without complication 35/32/9924   Erectile dysfunction 06/28/2016   MVP (mitral valve prolapse) 03/21/2016   Allergic rhinitis 07/24/2011    Past Surgical History:  Procedure Laterality Date   WISDOM TOOTH EXTRACTION  2001       Family History  Problem Relation Age of Onset   Depression Mother    Depression Father    Diabetes Father    Heart disease Father    Hyperlipidemia Father    Hypertension Father    Colon cancer Father    Cancer Maternal Grandmother    Colon cancer Paternal Grandfather    Esophageal cancer Maternal Grandfather    Stomach cancer Neg Hx    Rectal cancer Neg Hx     Social History   Tobacco Use   Smoking status: Never   Smokeless tobacco: Never  Vaping Use   Vaping Use: Never used  Substance Use Topics   Alcohol use: Yes   Drug use: Never    Home Medications Prior to Admission medications    Medication Sig Start Date End Date Taking? Authorizing Provider  aspirin EC 81 MG tablet Take 81 mg by mouth daily. Swallow whole.    [provider]  EPINEPHrine 0.3 mg/0.3 mL IJ SOAJ injection Inject 0.3 mg into the muscle as needed for anaphylaxis. Patient not taking: Reported on 09/02/2021 05/23/21   Leamon Arnt, MD  lisinopril (ZESTRIL) 5 MG tablet TAKE 1 TABLET DAILY 08/15/21   Leamon Arnt, MD  Multiple Vitamins-Minerals (MULTIVITAMIN WITH MINERALS) tablet Take 1 tablet by mouth daily.    [provider]  Omega 3-6-9 Fatty Acids (OMEGA-3-6-9 PO) Take 2 each by mouth daily.    [provider]  sildenafil (REVATIO) 20 MG tablet Take 1 to 5 tablets as needed 05/28/19   [name], DO  testosterone cypionate (DEPOTESTOSTERONE CYPIONATE) 200 MG/ML injection Inject into the muscle every 14 (fourteen) days.    [provider]    Allergies    Patient has no known allergies.  Review of Systems   Review of Systems  Constitutional:  Negative for chills and fever.  Respiratory:  Negative for shortness of breath.   Cardiovascular:  Negative for chest pain.   Physical Exam Updated Vital Signs BP 139/87 (BP Location: Right Arm)   Pulse 75   Temp 98.2 F (36.8  C) (Oral)   Resp 16   Ht 5\' 11"  (1.803 m)   Wt 90.7 kg   SpO2 97%   BMI 27.89 kg/m   Physical Exam Vitals and nursing note reviewed.  Constitutional:      General: He is not in acute distress.    Appearance: He is well-developed.  HENT:     Head: Normocephalic and atraumatic.     Right Ear: External ear normal.     Left Ear: External ear normal.     Mouth/Throat:     Pharynx: Oropharynx is clear.  Eyes:     General: No scleral icterus.       Right eye: No discharge.        Left eye: No discharge.     Conjunctiva/sclera: Conjunctivae normal.  Neck:     Trachea: No tracheal deviation.  Cardiovascular:     Rate and Rhythm: Normal rate.  Pulmonary:     Effort: Pulmonary  effort is normal. No respiratory distress.     Breath sounds: No stridor.  Abdominal:     General: There is no distension.  Musculoskeletal:        General: No swelling or deformity.     Cervical back: Neck supple.  Skin:    General: Skin is warm and dry.     Findings: No rash.  Neurological:     Mental Status: He is alert.     Cranial Nerves: Cranial nerve deficit: no gross deficits.   ED Results / Procedures / Treatments   Labs (all labs ordered are listed, but only abnormal results are displayed) Labs Reviewed  CBC WITH DIFFERENTIAL/PLATELET  BASIC METABOLIC PANEL    EKG None  Radiology No results found.  Procedures Procedures   Medications Ordered in ED Medications - No data to display  ED Course  I have reviewed the triage vital signs and the nursing notes.  Pertinent labs & imaging results that were available during my care of the patient were reviewed by me and considered in my medical decision making (see chart for details).    MDM Rules/Calculators/A&P                          Patient is a 50 year old male who presents to the emergency department for preprocedure screening labs.  Patient was recently out of the state and was unable to obtain a CBC and BMP that were required to get a TEE as well as heart cath later this week.  Patient has no physical complaints at this time.  We will obtain his lab work and discharge him home.  This plan was discussed with the patient and he is agreeable.  His questions were answered and he was amicable at the time of discharge.  Final Clinical Impression(s) / ED Diagnoses Final diagnoses:  Encounter for preprocedural laboratory examination   Rx / DC Orders ED Discharge Orders     None        Rayna Sexton, PA-C 09/06/21 2044    Lorelle Gibbs, DO 09/06/21 2305

## 2021-09-06 NOTE — Discharge Instructions (Addendum)
Please return to the emergency department with any new or worsening symptoms.

## 2021-09-06 NOTE — ED Triage Notes (Signed)
Pt requesting labs drawn for his  procedure on Thursday

## 2021-09-07 ENCOUNTER — Other Ambulatory Visit: Payer: Self-pay | Admitting: *Deleted

## 2021-09-07 ENCOUNTER — Telehealth: Payer: Self-pay | Admitting: *Deleted

## 2021-09-07 DIAGNOSIS — I340 Nonrheumatic mitral (valve) insufficiency: Secondary | ICD-10-CM

## 2021-09-07 NOTE — Telephone Encounter (Signed)
Cardiac catheterization scheduled at [facility] for: Thursday September 08, 2021 10:30 AM/TEE 9 AM [facility] Main Entrance A [facility]) at: 8 AM   Diet-nothing to eat or drink after midnight, may have sips of water to take medications.   Medication instructions for procedure: -Usual morning medications can be taken pre-cath with sips of water including aspirin 81 mg.    Confirmed patient has responsible adult to drive home post procedure and be with patient first 24 hours after arriving home.  [facility] does allow one visitor to accompany you and wait in the hospital waiting room while you are there for your procedure. You and your visitor will be asked to wear a mask once you enter the hospital.   Patient reports does not currently have any new symptoms concerning for COVID-19 and no household members with COVID-19 like illness.        Reviewed procedure/mask/visitor instructions with patient.

## 2021-09-08 ENCOUNTER — Ambulatory Visit (HOSPITAL_COMMUNITY): Payer: Managed Care, Other (non HMO) | Admitting: Anesthesiology

## 2021-09-08 ENCOUNTER — Other Ambulatory Visit: Payer: Self-pay

## 2021-09-08 ENCOUNTER — Ambulatory Visit (HOSPITAL_BASED_OUTPATIENT_CLINIC_OR_DEPARTMENT_OTHER)
Admission: RE | Admit: 2021-09-08 | Discharge: 2021-09-08 | Disposition: A | Discharge status: Home / Self Care | Payer: Managed Care, Other (non HMO) | Source: Ambulatory Visit | Attending: Cardiovascular Disease | Admitting: Cardiovascular Disease

## 2021-09-08 ENCOUNTER — Encounter (HOSPITAL_COMMUNITY)
Admission: RE | Disposition: A | Discharge status: Home / Self Care | Payer: Self-pay | Source: Home / Self Care | Attending: Cardiovascular Disease

## 2021-09-08 ENCOUNTER — Encounter (HOSPITAL_COMMUNITY): Payer: Self-pay | Admitting: Cardiovascular Disease

## 2021-09-08 ENCOUNTER — Ambulatory Visit (HOSPITAL_COMMUNITY)
Admission: RE | Admit: 2021-09-08 | Discharge: 2021-09-08 | Disposition: A | Discharge status: Home / Self Care | Payer: Managed Care, Other (non HMO) | Attending: Cardiovascular Disease | Admitting: Cardiovascular Disease

## 2021-09-08 ENCOUNTER — Encounter (HOSPITAL_COMMUNITY)
Admission: RE | Disposition: A | Discharge status: Home / Self Care | Payer: Managed Care, Other (non HMO) | Source: Home / Self Care | Attending: Cardiovascular Disease

## 2021-09-08 DIAGNOSIS — I5032 Chronic diastolic (congestive) heart failure: Secondary | ICD-10-CM | POA: Diagnosis not present

## 2021-09-08 DIAGNOSIS — I340 Nonrheumatic mitral (valve) insufficiency: Secondary | ICD-10-CM | POA: Diagnosis not present

## 2021-09-08 DIAGNOSIS — R079 Chest pain, unspecified: Secondary | ICD-10-CM | POA: Insufficient documentation

## 2021-09-08 DIAGNOSIS — I341 Nonrheumatic mitral (valve) prolapse: Secondary | ICD-10-CM | POA: Diagnosis not present

## 2021-09-08 DIAGNOSIS — I110 Hypertensive heart disease with heart failure: Secondary | ICD-10-CM | POA: Insufficient documentation

## 2021-09-08 HISTORY — PX: RIGHT/LEFT HEART CATH AND CORONARY ANGIOGRAPHY: CATH118266

## 2021-09-08 HISTORY — PX: TEE WITHOUT CARDIOVERSION: SHX5443

## 2021-09-08 LAB — POCT I-STAT EG7
Acid-base deficit: 1 mmol/L (ref 0.0–2.0)
Bicarbonate: 25.7 mmol/L (ref 20.0–28.0)
Calcium, Ion: 1.2 mmol/L (ref 1.15–1.40)
HCT: 42 % (ref 39.0–52.0)
Hemoglobin: 14.3 g/dL (ref 13.0–17.0)
O2 Saturation: 75 %
Potassium: 4 mmol/L (ref 3.5–5.1)
Sodium: 141 mmol/L (ref 135–145)
TCO2: 27 mmol/L (ref 22–32)
pCO2, Ven: 48.1 mmHg (ref 44.0–60.0)
pH, Ven: 7.336 (ref 7.250–7.430)
pO2, Ven: 43 mmHg (ref 32.0–45.0)

## 2021-09-08 LAB — POCT I-STAT 7, (LYTES, BLD GAS, ICA,H+H)
Acid-base deficit: 1 mmol/L (ref 0.0–2.0)
Bicarbonate: 24.7 mmol/L (ref 20.0–28.0)
Calcium, Ion: 1.14 mmol/L — ABNORMAL LOW (ref 1.15–1.40)
HCT: 41 % (ref 39.0–52.0)
Hemoglobin: 13.9 g/dL (ref 13.0–17.0)
O2 Saturation: 98 %
Potassium: 3.9 mmol/L (ref 3.5–5.1)
Sodium: 142 mmol/L (ref 135–145)
TCO2: 26 mmol/L (ref 22–32)
pCO2 arterial: 44 mmHg (ref 32.0–48.0)
pH, Arterial: 7.357 (ref 7.350–7.450)
pO2, Arterial: 120 mmHg — ABNORMAL HIGH (ref 83.0–108.0)

## 2021-09-08 LAB — ECHO TEE: Radius: 1.6 cm

## 2021-09-08 SURGERY — ECHOCARDIOGRAM, TRANSESOPHAGEAL
Anesthesia: Monitor Anesthesia Care

## 2021-09-08 SURGERY — RIGHT/LEFT HEART CATH AND CORONARY ANGIOGRAPHY
Anesthesia: LOCAL

## 2021-09-08 MED ORDER — MIDAZOLAM HCL 2 MG/2ML IJ SOLN
INTRAMUSCULAR | Status: DC | PRN
  Administered 2021-09-08: 1 mg via INTRAVENOUS

## 2021-09-08 MED ORDER — LIDOCAINE HCL (PF) 1 % IJ SOLN
INTRAMUSCULAR | Status: AC
  Filled 2021-09-08: qty 30

## 2021-09-08 MED ORDER — VERAPAMIL HCL 2.5 MG/ML IV SOLN
INTRAVENOUS | Status: AC
  Filled 2021-09-08: qty 2

## 2021-09-08 MED ORDER — SODIUM CHLORIDE 0.9% FLUSH: 3.0000 mL | Freq: Two times a day (BID) | INTRAVENOUS | Status: DC

## 2021-09-08 MED ORDER — HEPARIN SODIUM (PORCINE) 1000 UNIT/ML IJ SOLN
INTRAMUSCULAR | Status: DC | PRN
  Administered 2021-09-08: 4500 [IU] via INTRAVENOUS

## 2021-09-08 MED ORDER — HEPARIN (PORCINE) IN NACL 2000-0.9 UNIT/L-% IV SOLN
INTRAVENOUS | Status: AC
  Filled 2021-09-08: qty 1000

## 2021-09-08 MED ORDER — SODIUM CHLORIDE 0.9 % WEIGHT BASED INFUSION: 1.0000 mL/kg/h | INTRAVENOUS | Status: DC

## 2021-09-08 MED ORDER — HYDRALAZINE HCL 20 MG/ML IJ SOLN: 10.0000 mg | INTRAMUSCULAR | Status: DC | PRN

## 2021-09-08 MED ORDER — FENTANYL CITRATE (PF) 100 MCG/2ML IJ SOLN
INTRAMUSCULAR | Status: DC | PRN
  Administered 2021-09-08: 25 ug via INTRAVENOUS

## 2021-09-08 MED ORDER — LACTATED RINGERS IV SOLN: INTRAVENOUS | Status: DC | PRN

## 2021-09-08 MED ORDER — MIDAZOLAM HCL 2 MG/2ML IJ SOLN
INTRAMUSCULAR | Status: AC
  Filled 2021-09-08: qty 2

## 2021-09-08 MED ORDER — ASPIRIN 81 MG PO CHEW: 81.0000 mg | CHEWABLE_TABLET | ORAL | Status: DC

## 2021-09-08 MED ORDER — SODIUM CHLORIDE 0.9 % IV SOLN: 250.0000 mL | INTRAVENOUS | Status: DC | PRN

## 2021-09-08 MED ORDER — ACETAMINOPHEN 325 MG PO TABS: 650.0000 mg | ORAL_TABLET | ORAL | Status: DC | PRN

## 2021-09-08 MED ORDER — ONDANSETRON HCL 4 MG/2ML IJ SOLN: 4.0000 mg | Freq: Four times a day (QID) | INTRAMUSCULAR | Status: DC | PRN

## 2021-09-08 MED ORDER — LIDOCAINE HCL (PF) 1 % IJ SOLN
INTRAMUSCULAR | Status: DC | PRN
  Administered 2021-09-08 (×2): 2 mL

## 2021-09-08 MED ORDER — LABETALOL HCL 5 MG/ML IV SOLN: 10.0000 mg | INTRAVENOUS | Status: DC | PRN

## 2021-09-08 MED ORDER — FENTANYL CITRATE (PF) 100 MCG/2ML IJ SOLN
INTRAMUSCULAR | Status: AC
  Filled 2021-09-08: qty 2

## 2021-09-08 MED ORDER — SODIUM CHLORIDE 0.9 % WEIGHT BASED INFUSION: 3.0000 mL/kg/h | INTRAVENOUS | Status: AC

## 2021-09-08 MED ORDER — PROPOFOL 500 MG/50ML IV EMUL
INTRAVENOUS | Status: DC | PRN
  Administered 2021-09-08: 115 ug/kg/min via INTRAVENOUS

## 2021-09-08 MED ORDER — VERAPAMIL HCL 2.5 MG/ML IV SOLN: INTRAVENOUS | Status: DC | PRN

## 2021-09-08 MED ORDER — SODIUM CHLORIDE 0.9 % IV SOLN
INTRAVENOUS | Status: AC | PRN
  Administered 2021-09-08: 500 mL via INTRAVENOUS

## 2021-09-08 MED ORDER — IOHEXOL 350 MG/ML SOLN
INTRAVENOUS | Status: DC | PRN
  Administered 2021-09-08: 50 mL

## 2021-09-08 MED ORDER — LIDOCAINE 2% (20 MG/ML) 5 ML SYRINGE
INTRAMUSCULAR | Status: DC | PRN
  Administered 2021-09-08: 60 mg via INTRAVENOUS

## 2021-09-08 MED ORDER — SODIUM CHLORIDE 0.9% FLUSH: 3.0000 mL | INTRAVENOUS | Status: DC | PRN

## 2021-09-08 MED ORDER — SODIUM CHLORIDE 0.9 % IV SOLN: INTRAVENOUS | Status: AC

## 2021-09-08 MED ORDER — HEPARIN SODIUM (PORCINE) 1000 UNIT/ML IJ SOLN
INTRAMUSCULAR | Status: AC
  Filled 2021-09-08: qty 10

## 2021-09-08 MED ORDER — HEPARIN (PORCINE) IN NACL 2000-0.9 UNIT/L-% IV SOLN
INTRAVENOUS | Status: DC | PRN
  Administered 2021-09-08: 1000 mL

## 2021-09-08 SURGICAL SUPPLY — 11 items

## 2021-09-08 NOTE — Discharge Instructions (Signed)

## 2021-09-08 NOTE — Anesthesia Procedure Notes (Addendum)
Procedure Name: MAC Date/Time: 09/08/2021 8:40 AM Performed by: Rande Brunt, CRNA Pre-anesthesia Checklist: Patient identified, Emergency Drugs available, Suction available, Patient being monitored and Timeout performed Patient Re-evaluated:Patient Re-evaluated prior to induction Oxygen Delivery Method: Simple face mask Induction Type: IV induction Airway Equipment and Method: Bite block Placement Confirmation: positive ETCO2 and breath sounds checked- equal and bilateral Dental Injury: Teeth and Oropharynx as per pre-operative assessment

## 2021-09-08 NOTE — Interval H&P Note (Signed)
History and Physical Interval Note:  09/08/2021 7:56 AM  Frederick Martin  has presented today for surgery, with the diagnosis of MITRAL REGURGITATION.  The various methods of treatment have been discussed with the patient and family. After consideration of risks, benefits and other options for treatment, the patient has consented to  Procedure(s): TRANSESOPHAGEAL ECHOCARDIOGRAM (TEE) (N/A) as a surgical intervention.  The patient's history has been reviewed, patient examined, no change in status, stable for surgery.  I have reviewed the patient's chart and labs.  Questions were answered to the patient's satisfaction.     Jenkins Rouge

## 2021-09-08 NOTE — Anesthesia Postprocedure Evaluation (Signed)
Anesthesia Post Note  Patient: Frederick Martin  Procedure(s) Performed: TRANSESOPHAGEAL ECHOCARDIOGRAM (TEE)     Patient location during evaluation: Endoscopy Anesthesia Type: MAC Level of consciousness: awake and alert Pain management: pain level controlled Vital Signs Assessment: post-procedure vital signs reviewed and stable Respiratory status: spontaneous breathing, nonlabored ventilation, respiratory function stable and patient connected to nasal cannula oxygen Cardiovascular status: stable and blood pressure returned to baseline Postop Assessment: no apparent nausea or vomiting Anesthetic complications: no   No notable events documented.  Last Vitals:  Vitals:   09/08/21 0943 09/08/21 1041  BP: (!) 132/96   Pulse: 70   Resp: (!) 22   Temp:    SpO2: 98% 100%    Last Pain:  Vitals:   09/08/21 1102  TempSrc:   PainSc: Asleep                 Belenda Cruise P Lenix Kidd

## 2021-09-08 NOTE — Transfer of Care (Signed)
Immediate Anesthesia Transfer of Care Note  Patient: Brick Ketcher  Procedure(s) Performed: TRANSESOPHAGEAL ECHOCARDIOGRAM (TEE)  Patient Location: Endoscopy Unit  Anesthesia Type:MAC  Level of Consciousness: awake, alert  and drowsy  Airway & Oxygen Therapy: Patient Spontanous Breathing  Post-op Assessment: Report given to RN, Post -op Vital signs reviewed and stable and Patient moving all extremities  Post vital signs: Reviewed and stable  Last Vitals:  Vitals Value Taken Time  BP 106/65 09/08/21 0914  Temp    Pulse 77 09/08/21 0914  Resp 15 09/08/21 0914  SpO2 97 % 09/08/21 0914  Vitals shown include unvalidated device data.  Last Pain:  Vitals:   09/08/21 0752  TempSrc: Oral  PainSc: 0-No pain         Complications: No notable events documented.

## 2021-09-08 NOTE — Anesthesia Preprocedure Evaluation (Signed)
Anesthesia Evaluation  Patient identified by MRN, date of birth, ID band Patient awake    Reviewed: Allergy & Precautions, NPO status , Patient's Chart, lab work & pertinent test results  Airway Mallampati: II  TM Distance: >3 FB Neck ROM: Full    Dental no notable dental hx.    Pulmonary asthma ,    Pulmonary exam normal        Cardiovascular hypertension, Pt. on medications + Valvular Problems/Murmurs MR  Rhythm:Regular Rate:Normal + Systolic murmurs    Neuro/Psych negative neurological ROS  negative psych ROS   GI/Hepatic Neg liver ROS, GERD  ,  Endo/Other  negative endocrine ROS  Renal/GU negative Renal ROS  negative genitourinary   Musculoskeletal negative musculoskeletal ROS (+)   Abdominal Normal abdominal exam  (+)   Peds  Hematology negative hematology ROS (+)   Anesthesia Other Findings   Reproductive/Obstetrics                             Anesthesia Physical Anesthesia Plan  ASA: 2  Anesthesia Plan: MAC   Post-op Pain Management:    Induction: Intravenous  PONV Risk Score and Plan: 1 and Propofol infusion and Treatment may vary due to age or medical condition  Airway Management Planned: Simple Face Mask, Natural Airway and Nasal Cannula  Additional Equipment: None  Intra-op Plan:   Post-operative Plan:   Informed Consent: I have reviewed the patients History and Physical, chart, labs and discussed the procedure including the risks, benefits and alternatives for the proposed anesthesia with the patient or authorized representative who has indicated his/her understanding and acceptance.     Dental advisory given  Plan Discussed with: CRNA  Anesthesia Plan Comments: (Lab Results      Component                Value               Date                      WBC                      8.1                 09/06/2021                HGB                      15.4                 09/06/2021                HCT                      46.3                09/06/2021                MCV                      86.1                09/06/2021                PLT  258                 09/06/2021           Lab Results      Component                Value               Date                      NA                       136                 09/06/2021                K                        3.9                 09/06/2021                CO2                      25                  09/06/2021                GLUCOSE                  100 (H)             09/06/2021                BUN                      15                  09/06/2021                CREATININE               0.85                09/06/2021                CALCIUM                  8.6 (L)             09/06/2021                GFRNONAA                 >60                 09/06/2021            ECHO 11/22: 1. Left ventricular ejection fraction, by estimation, is 65 to 70%. The  left ventricle has normal function. The left ventricle has no regional  wall motion abnormalities. There is mild left ventricular hypertrophy.  Left ventricular diastolic parameters  were normal.  2. Right ventricular systolic function is normal. The right ventricular  size is normal. The estimated right ventricular systolic pressure is 57.3  mmHg.  3. Left atrial size was moderately dilated.  4. The aortic valve was not well visualized. Aortic valve regurgitation  is not visualized. No aortic stenosis is present.  5. The inferior vena cava is normal in size with greater than 50%  respiratory variability, suggesting right atrial pressure of 3 mmHg.  6. The mitral valve is abnormal. Severe mitral valve regurgitation.  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 Recommend TEE for further evaluation )        Anesthesia Quick Evaluation

## 2021-09-08 NOTE — CV Procedure (Signed)
TEE: Anesthesia: Propofol  Severe anteriorly directed MR Flail lateral segment of P2 Normal EF 60% No ASD/PFO No LAA thormbus Normal AV No effusion   Patient for cath today then referral to [facility] for MV repair  Jenkins Rouge MD [facility]

## 2021-09-08 NOTE — Interval H&P Note (Signed)
History and Physical Interval Note:  09/08/2021 10:22 AM  Frederick Martin  has presented today for surgery, with the diagnosis of mitral valve reg.  The various methods of treatment have been discussed with the patient and family. After consideration of risks, benefits and other options for treatment, the patient has consented to  Procedure(s): RIGHT/LEFT HEART CATH AND CORONARY ANGIOGRAPHY (N/A) as a surgical intervention.  The patient's history has been reviewed, patient examined, no change in status, stable for surgery.  I have reviewed the patient's chart and labs.  Questions were answered to the patient's satisfaction.    Cath Lab Visit (complete for each Cath Lab visit)  Clinical Evaluation Leading to the Procedure:   ACS: No.  Non-ACS:    Anginal Classification: No Symptoms  Anti-ischemic medical therapy: No Therapy  Non-Invasive Test Results: No non-invasive testing performed  Prior CABG: No previous CABG    Lauree Chandler

## 2021-09-08 NOTE — Progress Notes (Signed)
Patient ambulated and is feeling much better. VSS.

## 2021-09-08 NOTE — Progress Notes (Signed)
When removing patients IV, patient stated that he started to "feel funny" Patient did not lose consciousness, no complaints of nausea, vomiting or diaphoresis. Patients blood pressure was 98/60 and HR was 65. Patient laid flat with feet up. Patient reported feeling better after this. Vital signs returning to normal and patient denies symptoms. Slowly sat patient up with no issues. Patient stated that he is not ready to stand yet. Ginger Ale provided to patient and vital signs remain stable. Will continue to monitor.

## 2021-09-09 ENCOUNTER — Encounter: Payer: Self-pay | Admitting: Cardiovascular Disease

## 2021-09-09 ENCOUNTER — Encounter (HOSPITAL_COMMUNITY): Payer: Self-pay | Admitting: Cardiovascular Disease

## 2021-09-09 DIAGNOSIS — I340 Nonrheumatic mitral (valve) insufficiency: Secondary | ICD-10-CM

## 2021-09-11 ENCOUNTER — Encounter (HOSPITAL_COMMUNITY): Payer: Self-pay | Admitting: Cardiovascular Disease

## 2021-09-12 ENCOUNTER — Ambulatory Visit: Payer: Managed Care, Other (non HMO) | Admitting: Internal Medicine

## 2021-09-30 ENCOUNTER — Ambulatory Visit: Payer: Managed Care, Other (non HMO) | Admitting: Family Medicine

## 2021-10-19 ENCOUNTER — Encounter: Payer: Self-pay | Admitting: Cardiovascular Disease

## 2021-11-11 DIAGNOSIS — Z9889 Other specified postprocedural states: Secondary | ICD-10-CM | POA: Insufficient documentation

## 2021-11-15 ENCOUNTER — Ambulatory Visit: Payer: Managed Care, Other (non HMO) | Admitting: Family Medicine

## 2021-11-24 ENCOUNTER — Telehealth (HOSPITAL_COMMUNITY): Payer: Self-pay | Admitting: *Deleted

## 2021-11-24 NOTE — Telephone Encounter (Signed)
Received referral notification from Dr. Cheree Ditto at [facility] for this pt to participate in Cardiac Rehab s/p 11/11/21 MV Repair at [facility]  Unfortunately the referral is signed by NP unable to use as it must be signed by MD for insurance to reimburse.  Called and spoke to pt who is interested in participating. Reviewed admission notes and discharge summary. Pt has upcoming follow up appt with Dr. Adelene Idler on 2/27.  Asked his plans for ongoing cardiology needs.  Pt seen by Dr. Sallyanne Kuster prior to his surgery at [facility] and he would like to continue to see him locally.  Pt does not have an appt scheduled.  Encouraged him to contact the office and advise of his surgery at [facility] and schedule a follow up appt. Would like for Dr. Sallyanne Kuster to place referral for cardiac rehab is appropriate to proceed with group exercise -electronically and perform EKG for comparison to rhythm strips here at rehab.  Pt to follow back up with me for any updates with scheduling his follow up appt. Cherre Huger, BSN Cardiac and Training and development officer

## 2021-11-29 ENCOUNTER — Telehealth: Payer: Self-pay | Admitting: Cardiovascular Disease

## 2021-11-29 NOTE — Telephone Encounter (Signed)
Patient is calling stating he had his procedure Dr. Sallyanne Kuster referred him to [facility] for on 02/10 with Dr. Cheree Ditto. He states [facility] has faxed everything over to the office and he would like to do his follow up care with Dr. Sallyanne Kuster. He is needing a note to return to work and would like to setup cardiac rehab as well. Please advise.

## 2021-11-30 ENCOUNTER — Encounter (HOSPITAL_COMMUNITY): Payer: Self-pay | Admitting: Radiology

## 2021-11-30 NOTE — Telephone Encounter (Signed)
-  Spoke to pt ?-He report he recently had MVR at [facility] on 2/10 with Dr. Cheree Ditto. Pt state he is requesting to set up all post care with Dr. Loletha Grayer to prevent traveling to [facility].  ?-Pt has an appointment scheduled with Dr. Loletha Grayer on 3/16. Pt state he will discuss cardiac rehab at that time.  ?-Pt also requesting a return to work note. Nurse advised Dr. Cheree Ditto office would have to provide note as they completed the procedure. ?-Pt verbalized understanding ?

## 2021-12-01 NOTE — Telephone Encounter (Signed)
-   Pt made aware and state he is ok to wait on 3/16 but if he feels like he is ready to return before then, he will reach out to [facility].  ?

## 2021-12-01 NOTE — Telephone Encounter (Signed)
Thanks, Frederick Martin. I can also take care of his return to work letter, if he is OK with waiting until the appointment  03/16. ?

## 2021-12-15 ENCOUNTER — Encounter: Payer: Self-pay | Admitting: Cardiovascular Disease

## 2021-12-15 ENCOUNTER — Other Ambulatory Visit: Payer: Self-pay

## 2021-12-15 ENCOUNTER — Ambulatory Visit (HOSPITAL_COMMUNITY)
Admission: RE | Admit: 2021-12-15 | Discharge: 2021-12-15 | Disposition: A | Discharge status: Home / Self Care | Payer: 59 | Source: Ambulatory Visit | Attending: Cardiovascular Disease | Admitting: Cardiovascular Disease

## 2021-12-15 ENCOUNTER — Ambulatory Visit: Payer: 59 | Admitting: Cardiovascular Disease

## 2021-12-15 VITALS — BP 122/80 | HR 114 | Ht 71.0 in | Wt 205.0 lb

## 2021-12-15 DIAGNOSIS — I3139 Other pericardial effusion (noninflammatory): Secondary | ICD-10-CM | POA: Insufficient documentation

## 2021-12-15 DIAGNOSIS — I340 Nonrheumatic mitral (valve) insufficiency: Secondary | ICD-10-CM | POA: Insufficient documentation

## 2021-12-15 DIAGNOSIS — I5032 Chronic diastolic (congestive) heart failure: Secondary | ICD-10-CM

## 2021-12-15 DIAGNOSIS — I517 Cardiomegaly: Secondary | ICD-10-CM | POA: Diagnosis not present

## 2021-12-15 DIAGNOSIS — Z952 Presence of prosthetic heart valve: Secondary | ICD-10-CM | POA: Diagnosis not present

## 2021-12-15 LAB — ECHOCARDIOGRAM LIMITED
Height: 71 in
S' Lateral: 4.1 cm
Weight: 3280 oz

## 2021-12-15 NOTE — Progress Notes (Signed)
?  Echocardiogram ?2D Echocardiogram has been performed. ? ?Frederick Martin ?12/15/2021, 2:39 PM ?

## 2021-12-15 NOTE — Patient Instructions (Signed)
Medication Instructions:  ?No changes ?*If you need a refill on your cardiac medications before your next appointment, please call your pharmacy* ? ? ?Lab Work: ?None ordered ?If you have labs (blood work) drawn today and your tests are completely normal, you will receive your results only by: ?MyChart Message (if you have MyChart) OR ?A paper copy in the mail ?If you have any lab test that is abnormal or we need to change your treatment, we will call you to review the results. ? ? ?Testing/Procedures: ?Your physician has requested that you have an  limited echocardiogram. Echocardiography is a painless test that uses sound waves to create images of your heart. It provides your doctor with information about the size and shape of your heart and how well your heart?s chambers and valves are working. You may receive an ultrasound enhancing agent through an IV if needed to better visualize your heart during the echo.This procedure takes approximately one hour. There are no restrictions for this procedure. ? ? ?Follow-Up: ?At [facility], you and your health needs are our priority.  As part of our continuing mission to provide you with exceptional heart care, we have created designated Provider Care Teams.  These Care Teams include your primary Cardiologist (physician) and Advanced Practice Providers (APPs -  Physician Assistants and Nurse Practitioners) who all work together to provide you with the care you need, when you need it. ? ?We recommend signing up for the patient portal called "MyChart".  Sign up information is provided on this After Visit Summary.  MyChart is used to connect with patients for Virtual Visits (Telemedicine).  Patients are able to view lab/test results, encounter notes, upcoming appointments, etc.  Non-urgent messages can be sent to your provider as well.   ?To learn more about what you can do with MyChart, go to [email or web address].   ? ?Your next appointment:   ?4 month(s) ? ?The format  for your next appointment:   ?In Person ? ?Provider:   ?Sanda Klein, MD { ? ?

## 2021-12-15 NOTE — Progress Notes (Signed)
?Cardiology Office Note:   ? ?Date:  12/15/2021  ? ?ID:  Frederick Martin, DOB 1971-01-05, MRN [id number] ? ?PCP:  Leamon Arnt, MD ?  ?[facility] Providers ?Cardiologist:  Sanda Klein, MD    ? ?Referring MD: Leamon Arnt, MD  ? ?Chief Complaint  ?Patient presents with  ? Cardiac Valve Problem  ? ? ? ?History of Present Illness:   ? ?Frederick Martin is a 51 y.o. male with a hx of mitral valve prolapse with symptomatic severe mitral insufficiency.  He returns in follow-up after undergoing mitral valve repair with Dr. Cheree Ditto (11/11/2021, 40 mm stimulus semirigid band). ? ?The surgical procedure was successful and no significant complications occurred other than brief urinary retention that required temporary Foley catheter insertion and transient orthostatic hypotension associated with diuretics.  ? ?The echo performed at [facility] on 11/15/2021 showed mild left ventricular systolic dysfunction EF 97%, with mild LVH, trivial residual MR, mitral valve peak gradient 6 mmHg, mean gradient 3 mmHg, estimated systolic PA pressure 30 mmHg.  No mention of pericardial effusion. ? ?He is doing quite well and is walking an hour a day, almost every day of the week whether allowing.  He has not had any problems with chest pain or shortness of breath.  Pain is well controlled.  He has noticed that his heart rate is always fast, typically in the 120 range when walking to the.  His smart watch shows a resting average heart rate of 89 bpm (including nighttime hours). ? ?In addition to the tachycardia, he has an unusually high pulse pressure (checked repeatedly today, blood pressure was 122/99 mmHg.  There was a suggestion of pulses paradoxus when I checked his blood pressure. ? ?He denies positional dyspnea, dizziness, syncope, orthopnea or PND, lower extremity edema.  He is no longer taking any diuretics and is also not taking any antihypertensive medications or anticoagulants.  He is on aspirin 81 mg daily.  He has not had any  bleeding issues. ? ?Frederick Martin has a history of GERD and a remote esophagoplasty, but he does not currently have any difficulty with swallowing food or medications. ? ?Past Medical History:  ?Diagnosis Date  ? Asthma   ? GERD (gastroesophageal reflux disease)   ? Heart murmur   ? Hypertension   ? MVP (mitral valve prolapse)   ? ? ?Past Surgical History:  ?Procedure Laterality Date  ? RIGHT/LEFT HEART CATH AND CORONARY ANGIOGRAPHY N/A 09/08/2021  ? Procedure: RIGHT/LEFT HEART CATH AND CORONARY ANGIOGRAPHY;  Surgeon: Burnell Blanks, MD;  Location: [place] CV LAB;  Service: Cardiovascular;  Laterality: N/A;  ? TEE WITHOUT CARDIOVERSION N/A 09/08/2021  ? Procedure: TRANSESOPHAGEAL ECHOCARDIOGRAM (TEE);  Surgeon: Josue Hector, MD;  Location: [facility] ENDOSCOPY;  Service: Cardiovascular;  Laterality: N/A;  ? [place] EXTRACTION  2001  ? ? ?Current Medications: ?Current Meds  ?Medication Sig  ? aspirin EC 81 MG tablet Take 81 mg by mouth daily. Swallow whole.  ? Multiple Vitamins-Minerals (MULTIVITAMIN WITH MINERALS) tablet Take 1 tablet by mouth daily.  ? Omega 3-6-9 Fatty Acids (OMEGA-3-6-9 PO) Take 2 each by mouth daily.  ? sildenafil (REVATIO) 20 MG tablet Take 1 to 5 tablets as needed  ? testosterone cypionate (DEPOTESTOSTERONE CYPIONATE) 200 MG/ML injection Inject into the muscle every 14 (fourteen) days.  ?  ? ?Allergies:   Patient has no known allergies.  ? ?Social History  ? ?Socioeconomic History  ? Marital status: Legally Separated  ?  Spouse name: Not on file  ?  Number of children: 3  ? Years of education: Not on file  ? Highest education level: Not on file  ?Occupational History  ? Occupation: Holiday representative  ?Tobacco Use  ? Smoking status: Never  ? Smokeless tobacco: Never  ?Vaping Use  ? Vaping Use: Never used  ?Substance and Sexual Activity  ? Alcohol use: Yes  ? Drug use: Never  ? Sexual activity: Yes  ?Other Topics Concern  ? Not on file  ?Social History Narrative  ? Not on file   ? ?Social Determinants of Health  ? ?Financial Resource Strain: Not on file  ?Food Insecurity: Not on file  ?Transportation Needs: Not on file  ?Physical Activity: Not on file  ?Stress: Not on file  ?Social Connections: Not on file  ?  ? ?Family History: ?The patient's family history includes Cancer in his maternal grandmother; Colon cancer in his father and paternal grandfather; Depression in his father and mother; Diabetes in his father; Esophageal cancer in his maternal grandfather; Heart disease in his father; Hyperlipidemia in his father; Hypertension in his father. There is no history of Stomach cancer or Rectal cancer. ? ?ROS:   ?Please see the history of present illness.    ? All other systems reviewed and are negative. ? ?EKGs/Labs/Other Studies Reviewed:   ? ?The following studies were reviewed today: ?Echocardiogram 11/15/2021 ?2D DIMENSIONS  ?AORTA          Values     Normal RangeMAIN PA      Values     Normal Range  ?     Annulus:  nm*  cm    [2 - 3.2]      PA Main:   2.2 cm    [1.5 - 2.1]  ?   Aorta Sin:   3.2 cm    [2.8 - 4]   RIGHT VENTRICLE  ? ST Junction:  nm*  cm    [2.3 - 3.5]    RV Base:   4.7 cm    [2.5 - 4.1]  ?   Asc.Aorta:   3.3 cm    [2.2 - 3.8]     RV Mid:   3.3 cm    [1.9 - 3.5]  ?LEFT VENTRICLE                         RV Length:  nm*  cm    [  ]  ?       LVIDd:   5.2 cm    [4.2 - 5.8] RIGHT ATRIUM  ?       LVIDs:   3.4 cm    [2.4 - 4]      RA Area:  23   cm2   [ <= 20]  ?      LVEDVi:  71.0 ml/m2 [34 - 74]         RAVi:  34   ml/m2 [11 - 39]  ?      LVESVi:  36.0 ml/m2 [11 - 31]   INFERIOR VENA CAVA  ?          FS:  35   %     [ >= 25]       Max.IVC:   2.1 cm    [ <= 2.1]  ?         SWT:   1.2 cm    [0.6 - 1]      Min.IVC:   1.5 cm    [ <=  1.7]  ?         PWT:   1.2 cm    [0.6 - 1]   __________________  ?LEFT ATRIUM                           nm* - not measured  ?     LA Diam:   4.5 cm    [3 - 4]  ?     LA Area:  28   cm2   [ <= 20]  ?   LA Volume: 101   mL    [18 - 58]  ?        LAVi:   46   ml/m2 [16 - 34]  ? ?ECHOCARDIOGRAPHIC DESCRIPTIONS -----------------------------------------------  ?AORTIC ROOT  ?        Size: Normal  ?  Dissection: INDETERM FOR DISSECTION  ? ?AORTIC VALVE  ?    Leaflets: Tricuspid             Morphology: Normal  ?    Mobility: Fully Mobile  ? ?LEFT VENTRICLE                                      Anterior: Normal  ?        Size: Normal                                 Lateral: HYPOCONTRACTILE  ? Contraction: REGIONALLY IMPAIRED                     Septal: Normal  ?  Closest EF: 50% (Estimated)  Calc.EF: 49% (3D)      Apical: Normal  ?   LV masses: No Masses                             Inferior: Normal  ?         LVH: MILD LVH CONCENTRIC                  Posterior: Normal  ? LV GLS(GE): -15.7% Normal Range [ <= -16]  ?Willette Brace.FxClass: INDETERMINATE  ? ?MITRAL VALVE  ?    Leaflets: Normal                  Mobility: Fully mobile  ?  Morphology: PROSTHETIC RING  ?     MV Note: 2x neochords to P2, 41m simulus semi-rigid band  ? ?LEFT ATRIUM  ?        Size: MODERATELY ENLARGED  ?   LA masses: No masses  ?              Normal IAS  ? ?MAIN PA  ?        Size: DILATED  ? ?PULMONIC VALVE  ?  Morphology: Normal  ?    Mobility: Fully Mobile  ? ?RIGHT VENTRICLE  ?        Size: MILDLY ENLARGED           Free wall: Normal  ? Contraction: Normal                    RV masses: No Masses  ?       TAPSE:   1.6 cm,  Normal Range [>= 1.6 cm]  ? ?TRICUSPID VALVE  ?    Leaflets: Normal                  Mobility: Fully mobile  ?  Morphology: Normal  ? ?RIGHT ATRIUM  ?        Size: MILDLY ENLARGED            RA Other: None  ?   RA masses: No masses  ? ?PERICARDIUM  ?       Fluid: No effusion  ? Pleural Eff: PLEURAL EFFUSION NOTED  ? ?INFERIOR VENACAVA  ?        Size: Normal     ABNORMAL RESPIRATORY COLLAPSE  ? ?DOPPLER ECHO and OTHER SPECIAL PROCEDURES ------------------------------------  ?   Aortic: TRIVIAL AR             No AS  ? ?   Mitral: TRIVIAL MR             PROSTHETIC MV RING  ?    1.2 m/s peak  vel    6 mmHg peak grad   3 mmHg mean grad  ?   MV Inflow E Vel.= 98.0 cm/s  MV Annulus E'Vel.= 11.0 cm/s  E/E'Ratio= 9  ? ?Tricuspid: TRIVIAL TR             No TS  ?           2.3 m/s peak TR vel   30 mmHg peak R

## 2021-12-19 ENCOUNTER — Encounter: Payer: Self-pay | Admitting: *Deleted

## 2021-12-19 ENCOUNTER — Encounter: Payer: Self-pay | Admitting: Cardiovascular Disease

## 2022-01-05 ENCOUNTER — Encounter: Payer: Self-pay | Admitting: Cardiovascular Disease

## 2022-01-05 MED ORDER — METOPROLOL SUCCINATE 25 MG PO CS24: 25.0000 mg | EXTENDED_RELEASE_CAPSULE | Freq: Every day | ORAL | 3 refills | Status: DC

## 2022-01-05 NOTE — Telephone Encounter (Signed)
Can we please send in a prescription for metoprolol succinate 25 mg once daily, #90, 3 refills ?

## 2022-01-26 ENCOUNTER — Ambulatory Visit: Payer: 59 | Admitting: Family Medicine

## 2022-01-26 ENCOUNTER — Encounter: Payer: Self-pay | Admitting: Family Medicine

## 2022-01-26 VITALS — BP 118/86 | HR 89 | Temp 98.6°F | Ht 71.0 in | Wt 207.8 lb

## 2022-01-26 DIAGNOSIS — Z9889 Other specified postprocedural states: Secondary | ICD-10-CM

## 2022-01-26 DIAGNOSIS — R0989 Other specified symptoms and signs involving the circulatory and respiratory systems: Secondary | ICD-10-CM

## 2022-01-26 DIAGNOSIS — I10 Essential (primary) hypertension: Secondary | ICD-10-CM

## 2022-01-26 DIAGNOSIS — R600 Localized edema: Secondary | ICD-10-CM | POA: Diagnosis not present

## 2022-01-26 DIAGNOSIS — I5032 Chronic diastolic (congestive) heart failure: Secondary | ICD-10-CM | POA: Insufficient documentation

## 2022-01-26 NOTE — Patient Instructions (Signed)
Please return in 6 months for your annual complete physical; please come fasting.  ? ?Make an appointment with Dr. Loletha Grayer to follow up on the heart and fluid issues.  ? ?If you have any questions or concerns, please don't hesitate to send me a message via MyChart or call the office at [phone]. Thank you for visiting with Frederick Martin today! It's our pleasure caring for you.  ?

## 2022-01-26 NOTE — Progress Notes (Signed)
? ? ?Subjective  ?CC:  ?Chief Complaint  ?Patient presents with  ? Hypertension  ?  Pt here to F/U with his Bp. Pt stated that he thinks that he is retaining fluid. His legs were swollen and he has gain 8lbs in the past week and feels bloated.  ? ? ?HPI: Frederick Martin is a 51 y.o. male who presents to the office today to address the problems listed above in the chief complaint. ?51 yo sp mitral valve replacment in February. I reviewed cardiothoracic and cardiology notes. Echocardiogram reports. And labs. Doing well but still feels fatigued. Started bb recently. Has noted mild swelling in ankles, dependent. Echo with mild depressed EF.  ?Hypertension f/u: Control is good . Pt reports he is doing well. Marland Kitchen He denies adverse effects from his BP medications. Compliance with medication is good.  ?Narrow pulse pressure.urgent echo did not show pericardial effusion. Resting tachy and bb was started.  ? ?Assessment  ?1. Essential hypertension   ?2. Narrowed pulse pressure   ?3. Status post mitral valve repair   ?4. Leg edema   ?5. CHF (congestive heart failure), NYHA class II, chronic, diastolic (HCC)   ? ?  ?Plan  ? ?Hypertension f/u: BP control is well controlled. On bb ?S/p MV annuloplasty f/u: good repair. Improving overall  ?CHF: to f/u with cards: may warrant diuretic but defer to cards due to issues with tachycardia and narrow pulse pressure that persists today.  ? ?Education regarding management of these chronic disease states was given. Management strategies discussed on successive visits include dietary and exercise recommendations, goals of achieving and maintaining IBW, and lifestyle modifications aiming for adequate sleep and minimizing stressors.  ? ?Follow up: 6 months for cpe ? ?No orders of the defined types were placed in this encounter. ? ?No orders of the defined types were placed in this encounter. ? ?  ? ?BP Readings from Last 3 Encounters:  ?01/27/22 112/80  ?01/26/22 118/86  ?12/15/21 122/80  ? ?Wt  Readings from Last 3 Encounters:  ?01/27/22 214 lb (97.1 kg)  ?01/26/22 207 lb 12.8 oz (94.3 kg)  ?12/15/21 205 lb (93 kg)  ? ? ?Lab Results  ?Component Value Date  ? CHOL 154 05/11/2021  ? CHOL 141 05/18/2020  ? CHOL 154 04/23/2019  ? ?Lab Results  ?Component Value Date  ? HDL 51.10 05/11/2021  ? HDL 50 05/18/2020  ? HDL 44.70 04/23/2019  ? ?Lab Results  ?Component Value Date  ? [place] 86 05/11/2021  ? [place] 76 05/18/2020  ? [place] 84 04/23/2019  ? ?Lab Results  ?Component Value Date  ? TRIG 83.0 05/11/2021  ? TRIG 69 05/18/2020  ? TRIG 127.0 04/23/2019  ? ?Lab Results  ?Component Value Date  ? CHOLHDL 3 05/11/2021  ? CHOLHDL 2.8 05/18/2020  ? CHOLHDL 3 04/23/2019  ? ?No results found for: LDLDIRECT ?Lab Results  ?Component Value Date  ? CREATININE 0.85 09/06/2021  ? BUN 15 09/06/2021  ? NA 141 09/08/2021  ? K 4.0 09/08/2021  ? CL 103 09/06/2021  ? CO2 25 09/06/2021  ? ? ?The 10-year ASCVD risk score (Arnett DK, et al., 2019) is: 1.7% ?  Values used to calculate the score: ?    Age: 89 years ?    Sex: Male ?    Is Non-Hispanic African American: No ?    Diabetic: No ?    Tobacco smoker: No ?    Systolic Blood Pressure: 287 mmHg ?    Is BP  treated: Yes ?    HDL Cholesterol: 51.1 mg/dL ?    Total Cholesterol: 154 mg/dL ? ?I reviewed the patients updated PMH, FH, and SocHx.  ?  ?Patient Active Problem List  ? Diagnosis Date Noted  ? Essential hypertension 09/27/2020  ?  Priority: High  ? Family history of colon cancer in father 04/05/2018  ?  Priority: High  ? 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  Priority: Medium   ? Gastroesophageal reflux disease 03/07/2017  ?  Priority: Medium   ? Mild intermittent asthma without complication 42/87/6811  ?  Priority: Medium   ? Erectile dysfunction 06/28/2016  ?  Priority: Medium   ? MVP (mitral valve prolapse) 03/21/2016  ?  Priority: Medium   ? Allergic rhinitis 07/24/2011  ?  Priority: Low  ? CHF (congestive heart failure), NYHA class II, chronic, diastolic ([place]) 57/26/2035  ?  S/P MVR (mitral valve repair) 11/11/2021  ? Severe mitral regurgitation   ? ? ?Allergies: Patient has no known allergies. ? ?Social History: ?Patient  reports that he has never smoked. He has never used smokeless tobacco. He reports current alcohol use. He reports that he does not use drugs. ? ?Current Meds  ?Medication Sig  ? aspirin EC 81 MG tablet Take 81 mg by mouth daily. Swallow whole.  ? Multiple Vitamins-Minerals (MULTIVITAMIN WITH MINERALS) tablet Take 1 tablet by mouth daily.  ? sildenafil (REVATIO) 20 MG tablet Take 1 to 5 tablets as needed (Patient not taking: Reported on 01/27/2022)  ? testosterone cypionate (DEPOTESTOSTERONE CYPIONATE) 200 MG/ML injection Inject into the muscle every 14 (fourteen) days.  ? ? ?Review of Systems: ?Cardiovascular: negative for chest pain, palpitations, leg swelling, orthopnea ?Respiratory: negative for SOB, wheezing or persistent cough ?Gastrointestinal: negative for abdominal pain ?Genitourinary: negative for dysuria or gross hematuria ? ?Objective  ?Vitals: BP 118/86   Pulse 89   Temp 98.6 ?F (37 ?C)   Ht '5\' 11"'$  (1.803 m)   Wt 207 lb 12.8 oz (94.3 kg)   SpO2 96%   BMI 28.98 kg/m?  ?General: no acute distress  ?Psych:  Alert and oriented, normal mood and affect ?HEENT:  Normocephalic, atraumatic, supple neck  ?Cardiovascular:  RRR without murmur. Tr ankle bilateral edema ?Respiratory:  Good breath sounds bilaterally, CTAB with normal respiratory effort ?Skin:  Warm, no rashes ?Neurologic:   Mental status is normal ?Commons side effects, risks, benefits, and alternatives for medications and treatment plan prescribed today were discussed, and the patient expressed understanding of the given instructions. Patient is instructed to call or message via MyChart if he/she has any questions or concerns regarding our treatment plan. No barriers to understanding were identified. We discussed Red Flag symptoms and signs in detail. Patient expressed understanding regarding what  to do in case of urgent or emergency type symptoms.  ?Medication list was reconciled, printed and provided to the patient in AVS. Patient instructions and summary information was reviewed with the patient as documented in the AVS. ?This note was prepared with assistance of Systems analyst. Occasional wrong-word or sound-a-like substitutions may have occurred due to the inherent limitations of voice recognition software ? ?This visit occurred during the SARS-CoV-2 public health emergency.  Safety protocols were in place, including screening questions prior to the visit, additional usage of staff PPE, and extensive cleaning of exam room while observing appropriate contact time as indicated for disinfecting solutions.  ?

## 2022-01-27 ENCOUNTER — Encounter: Payer: Self-pay | Admitting: Cardiovascular Disease

## 2022-01-27 ENCOUNTER — Ambulatory Visit: Payer: 59 | Admitting: Cardiovascular Disease

## 2022-01-27 VITALS — BP 112/80 | HR 93 | Ht 71.0 in | Wt 214.0 lb

## 2022-01-27 DIAGNOSIS — I5032 Chronic diastolic (congestive) heart failure: Secondary | ICD-10-CM | POA: Diagnosis not present

## 2022-01-27 DIAGNOSIS — Z9889 Other specified postprocedural states: Secondary | ICD-10-CM

## 2022-01-27 MED ORDER — FUROSEMIDE 20 MG PO TABS: ORAL_TABLET | ORAL | 3 refills | Status: DC

## 2022-01-27 MED ORDER — LOSARTAN POTASSIUM 25 MG PO TABS: 25.0000 mg | ORAL_TABLET | Freq: Every day | ORAL | 3 refills | Status: DC

## 2022-01-27 MED ORDER — BISOPROLOL FUMARATE 5 MG PO TABS: 2.5000 mg | ORAL_TABLET | Freq: Every day | ORAL | 11 refills | Status: DC

## 2022-01-27 NOTE — Progress Notes (Signed)
?Cardiology Office Note:   ? ?Date:  01/29/2022  ? ?ID:  Frederick Martin, DOB 1970/10/21, MRN [id number] ? ?PCP:  Leamon Arnt, MD ?  ?[facility] Providers ?Cardiologist:  Sanda Klein, MD    ? ?Referring MD: Leamon Arnt, MD  ? ?Chief Complaint  ?Patient presents with  ? Cardiac Valve Problem  ? ? ? ?History of Present Illness:   ? ?Frederick Martin is a 51 y.o. male with a hx of mitral valve prolapse with symptomatic severe mitral insufficiency.  He underwent mitral valve repair with Dr. Cheree Ditto (11/11/2021, 40 mm stimulus semirigid band). ? ?He had some problems with resting tachycardia and a narrow pulse pressure for which we repeated his echocardiogram due to concern for pericardial effusion.  There was no evidence of a pericardial effusion, left ventricular systolic function is mildly decreased with an EF of 49%.  The mitral valve repair is holding up nicely without any meaningful regurgitation and with a mean gradient of 3 mmHg despite heart rate of 110 bpm. ? ?The echo performed at [facility] on 11/15/2021 showed mild left ventricular systolic dysfunction EF 42%, with mild LVH, trivial residual MR, mitral valve peak gradient 6 mmHg, mean gradient 3 mmHg, estimated systolic PA pressure 30 mmHg.  No mention of pericardial effusion. ? ?He continues to note a little shortness of breath, for example when he has to climb more than a flight of stairs, but does not have shortness of breath through usual activity.  He has not had palpitations, dizziness or syncope.  He does not have lower extremity edema, orthopnea or PND.  He is off diuretics.  May be having occasional wheezing.  History of asthma, although he rarely needs bronchodilators. ? ?Frederick Martin has a history of GERD and a remote esophagoplasty, but he does not currently have any difficulty with swallowing food or medications. ? ?His home weight has been 207 pounds give or take a couple of pounds (in our office if this 7 pounds higher). ? ?Past Medical History:   ?Diagnosis Date  ? Asthma   ? GERD (gastroesophageal reflux disease)   ? Heart murmur   ? Hypertension   ? MVP (mitral valve prolapse)   ? ? ?Past Surgical History:  ?Procedure Laterality Date  ? RIGHT/LEFT HEART CATH AND CORONARY ANGIOGRAPHY N/A 09/08/2021  ? Procedure: RIGHT/LEFT HEART CATH AND CORONARY ANGIOGRAPHY;  Surgeon: Burnell Blanks, MD;  Location: [facility];  Service: Cardiovascular;  Laterality: N/A;  ? TEE WITHOUT CARDIOVERSION N/A 09/08/2021  ? Procedure: TRANSESOPHAGEAL ECHOCARDIOGRAM (TEE);  Surgeon: Josue Hector, MD;  Location: [facility] ENDOSCOPY;  Service: Cardiovascular;  Laterality: N/A;  ? [place] EXTRACTION  2001  ? ? ?Current Medications: ?Current Meds  ?Medication Sig  ? aspirin EC 81 MG tablet Take 81 mg by mouth daily. Swallow whole.  ? bisoprolol (ZEBETA) 5 MG tablet Take 0.5 tablets (2.5 mg total) by mouth daily.  ? furosemide (LASIX) 20 MG tablet Take one tablet daily as needed for a weight over 200 lbs.  ? losartan (COZAAR) 25 MG tablet Take 1 tablet (25 mg total) by mouth daily.  ? Multiple Vitamins-Minerals (MULTIVITAMIN WITH MINERALS) tablet Take 1 tablet by mouth daily.  ? testosterone cypionate (DEPOTESTOSTERONE CYPIONATE) 200 MG/ML injection Inject into the muscle every 14 (fourteen) days.  ? [DISCONTINUED] Metoprolol Succinate 25 MG CS24 Take 25 mg by mouth daily at 12 noon.  ?  ? ?Allergies:   Patient has no known allergies.  ? ?Social History  ? ?  Socioeconomic History  ? Marital status: Legally Separated  ?  Spouse name: Not on file  ? Number of children: 3  ? Years of education: Not on file  ? Highest education level: Not on file  ?Occupational History  ? Occupation: Holiday representative  ?Tobacco Use  ? Smoking status: Never  ? Smokeless tobacco: Never  ?Vaping Use  ? Vaping Use: Never used  ?Substance and Sexual Activity  ? Alcohol use: Yes  ? Drug use: Never  ? Sexual activity: Yes  ?Other Topics Concern  ? Not on file  ?Social History Narrative  ?  Not on file  ? ?Social Determinants of Health  ? ?Financial Resource Strain: Not on file  ?Food Insecurity: Not on file  ?Transportation Needs: Not on file  ?Physical Activity: Not on file  ?Stress: Not on file  ?Social Connections: Not on file  ?  ? ?Family History: ?The patient's family history includes Cancer in his maternal grandmother; Colon cancer in his father and paternal grandfather; Depression in his father and mother; Diabetes in his father; Esophageal cancer in his maternal grandfather; Heart disease in his father; Hyperlipidemia in his father; Hypertension in his father. There is no history of Stomach cancer or Rectal cancer. ? ?ROS:   ?Please see the history of present illness.    ? All other systems reviewed and are negative. ? ?EKGs/Labs/Other Studies Reviewed:   ? ?The following studies were reviewed today: ?Echocardiogram 12/15/2021 ? 1. Limited echo to evaluate mitral valve repair  ? 2. Left ventricular ejection fraction, by estimation, is 50 to 55%. The  ?left ventricle has low normal function. There is mild left ventricular  ?hypertrophy.  ? 3. The mitral valve has been repaired/replaced. Trivial mitral valve  ?regurgitation. No evidence of mitral stenosis. The mean mitral valve  ?gradient is 3.0 mmHg. Peak gradient 5.4 mmHg. There is a prosthetic  ?annuloplasty ring present in the mitral  ?position. Procedure Date: 11/11/2021.  ? 4. The aortic valve is tricuspid. Aortic valve regurgitation is not  ?visualized. No aortic stenosis is present.  ? ?EKG:  EKG is not ordered today.   ? ?Recent Labs: ?05/11/2021: ALT 32 ?09/06/2021: BUN 15; Creatinine, Ser 0.85; Platelets 258 ?09/08/2021: Hemoglobin 14.3; Potassium 4.0; Sodium 141  ?Recent Lipid Panel ?   ?Component Value Date/Time  ? CHOL 154 05/11/2021 1026  ? TRIG 83.0 05/11/2021 1026  ? HDL 51.10 05/11/2021 1026  ? CHOLHDL 3 05/11/2021 1026  ? VLDL 16.6 05/11/2021 1026  ? [place] 86 05/11/2021 1026  ? [place] 76 05/18/2020 1034  ? ? ? ?Risk  Assessment/Calculations:   ?  ? ?    ? ?Physical Exam:   ? ?VS:  BP 112/80 (BP Location: Left Arm, Patient Position: Sitting, Cuff Size: Large)   Pulse 93   Ht '5\' 11"'$  (1.803 m)   Wt 214 lb (97.1 kg)   SpO2 95%   BMI 29.85 kg/m?    ? ?Wt Readings from Last 3 Encounters:  ?01/27/22 214 lb (97.1 kg)  ?01/26/22 207 lb 12.8 oz (94.3 kg)  ?12/15/21 205 lb (93 kg)  ?  ?General: Alert, oriented x3, no distress, appears well ?Head: no evidence of trauma, PERRL, EOMI, no exophtalmos or lid lag, no myxedema, no xanthelasma; normal ears, nose and oropharynx ?Neck: normal jugular venous pulsations and no hepatojugular reflux; brisk carotid pulses without delay and no carotid bruits ?Chest: clear to auscultation, no signs of consolidation by percussion or palpation, normal fremitus, symmetrical and full  respiratory excursions ?Cardiovascular: normal position and quality of the apical impulse, regular rhythm, normal first and second heart sounds, no murmurs, rubs or gallops ?Abdomen: no tenderness or distention, no masses by palpation, no abnormal pulsatility or arterial bruits, normal bowel sounds, no hepatosplenomegaly ?Extremities: no clubbing, cyanosis or edema; 2+ radial, ulnar and brachial pulses bilaterally; 2+ right femoral, posterior tibial and dorsalis pedis pulses; 2+ left femoral, posterior tibial and dorsalis pedis pulses; no subclavian or femoral bruits ?Neurological: grossly nonfocal ?Psych: Normal mood and affect ? ? ? ?ASSESSMENT:   ? ?1. S/P MVR (mitral valve repair)   ?2. Chronic diastolic heart failure ([place])   ? ? ?PLAN:   ? ?In order of problems listed above: ? ?S/P MVRepair: Excellent technical result.  Mildly decreased LVEF postop consistent with longstanding severe MR.  Reminded him of the need for endocarditis prevention with dental procedures.   ?CHF with mildly reduced EF: NYHA class I. Postop EF 50% (49% x 3 D echo), consistent with what was probably longstanding severe MR before repair.  We  discussed the consequences of decreased EF and the fact that both left ventricular systolic function and functional status have independent predictive value.  Overall I think he will do well, but remains at risk for heart failu

## 2022-01-27 NOTE — Patient Instructions (Addendum)
Medication Instructions:  ?STOP the Metoprolol ? ?START Bisoprolol 2.5 mg once daily (half of the 5 mg tablet) ? ?TAKE Furosemide 20 mg once daily as needed for a weight over 200 lbs ? ?*If you need a refill on your cardiac medications before your next appointment, please call your pharmacy* ? ? ?Lab Work: ?None ordered ?If you have labs (blood work) drawn today and your tests are completely normal, you will receive your results only by: ?MyChart Message (if you have MyChart) OR ?A paper copy in the mail ?If you have any lab test that is abnormal or we need to change your treatment, we will call you to review the results. ? ? ?Testing/Procedures: ?None ordered ? ? ?Follow-Up: ?At [facility], you and your health needs are our priority.  As part of our continuing mission to provide you with exceptional heart care, we have created designated Provider Care Teams.  These Care Teams include your primary Cardiologist (physician) and Advanced Practice Providers (APPs -  Physician Assistants and Nurse Practitioners) who all work together to provide you with the care you need, when you need it. ? ?We recommend signing up for the patient portal called "MyChart".  Sign up information is provided on this After Visit Summary.  MyChart is used to connect with patients for Virtual Visits (Telemedicine).  Patients are able to view lab/test results, encounter notes, upcoming appointments, etc.  Non-urgent messages can be sent to your provider as well.   ?To learn more about what you can do with MyChart, go to [email or web address].   ? ?Your next appointment:   ?Keep your scheduled follow up ? ?Other Instructions ?Your physician recommends that you weigh yourself everyday at the same time, on the same scale and with the same amount of clothing. Please keep a record of these weights and bring to your next appointment. ? ? ? ?

## 2022-01-29 ENCOUNTER — Encounter: Payer: Self-pay | Admitting: Cardiovascular Disease

## 2022-02-05 ENCOUNTER — Encounter: Payer: Self-pay | Admitting: Cardiovascular Disease

## 2022-02-06 MED ORDER — METOPROLOL SUCCINATE ER 25 MG PO TB24: 25.0000 mg | ORAL_TABLET | Freq: Every day | ORAL | 3 refills | Status: DC

## 2022-02-06 NOTE — Telephone Encounter (Signed)
Please ask him to; ?- Stop the bisoprolol and go back on his previous dose of metoprolol. ?- Stop the losartan (the most likely culprit). ?- Stop the furosemide for a few days. Restart it once rash is completely gone and after he has completed the course of methylprednisolone. ?- Touch base with [place] about the rash early next week. ?

## 2022-03-29 ENCOUNTER — Encounter: Payer: Self-pay | Admitting: Cardiovascular Disease

## 2022-03-30 MED ORDER — FUROSEMIDE 20 MG PO TABS: ORAL_TABLET | ORAL | 3 refills | Status: DC

## 2022-03-30 MED ORDER — BISOPROLOL FUMARATE 5 MG PO TABS: 2.5000 mg | ORAL_TABLET | Freq: Every day | ORAL | 3 refills | Status: DC

## 2022-05-03 ENCOUNTER — Encounter: Payer: Self-pay | Admitting: Cardiovascular Disease

## 2022-05-03 ENCOUNTER — Ambulatory Visit (INDEPENDENT_AMBULATORY_CARE_PROVIDER_SITE_OTHER): Payer: 59 | Admitting: Cardiovascular Disease

## 2022-05-03 VITALS — BP 122/74 | HR 75 | Ht 71.0 in | Wt 218.0 lb

## 2022-05-03 DIAGNOSIS — E669 Obesity, unspecified: Secondary | ICD-10-CM | POA: Diagnosis not present

## 2022-05-03 DIAGNOSIS — I5032 Chronic diastolic (congestive) heart failure: Secondary | ICD-10-CM

## 2022-05-03 DIAGNOSIS — Z9889 Other specified postprocedural states: Secondary | ICD-10-CM | POA: Diagnosis not present

## 2022-05-03 NOTE — Progress Notes (Signed)
Cardiology Office Note:    Date:  05/03/2022   ID:  Frederick Martin, DOB Jan 03, 1971, MRN [id number]  PCP:  Frederick Arnt, MD   [name] Cardiologist:  Frederick Klein, MD     Referring MD: Frederick Arnt, MD   Chief Complaint  Patient presents with   Cardiac Valve Problem     History of Present Illness:    Frederick Martin is a 51 y.o. male with a hx of mitral valve prolapse with symptomatic severe mitral insufficiency.  He underwent mitral valve repair with Dr. Cheree Martin (11/11/2021, 40 mm stimulus semirigid band), with an excellent surgical result (mean gradient 3 mmHg, no residual MR), but with mildly decreased LVEF around 50%.  He developed urticaria with angiotensin receptor blockers.  Feels well.  Has no exercise related shortness of breath or other complaints.  Continues to take a low-dose of furosemide a couple of times a week for what he perceives to be unusually low urine output, but he has not had edema, orthopnea, PND or significant exertional dyspnea.  He has to stop to catch his breath after climbing stairs for 3 stories.  He remains borderline obese with a BMI of 30, unfortunately has gained some weight since his last appointment.  Denies chest pain, palpitations, dizziness, syncope.  After initiating treatment with losartan he developed urticaria on both lower extremities.  This resolved after stopping the medication.  He now has mild pruritic rash in his left anterior chest, just medial of the axilla.  The area is mildly erythematous and mildly scaly, but there is no evidence of true urticaria.  Does not really look consistent with mycosis.  Could just be dry skin.    The echo performed at [facility] on 11/15/2021 showed mild left ventricular systolic dysfunction EF 72%, with mild LVH, trivial residual MR, mitral valve peak gradient 6 mmHg, mean gradient 3 mmHg, estimated systolic PA pressure 30 mmHg.  No mention of pericardial effusion.  Frederick Martin has a history of GERD and  a remote esophagoplasty, but he does not currently have any difficulty with swallowing food or medications.  Home scale usually shows about 7 pounds lower than our office scale.  Past Medical History:  Diagnosis Date   Asthma    GERD (gastroesophageal reflux disease)    Heart murmur    Hypertension    MVP (mitral valve prolapse)     Past Surgical History:  Procedure Laterality Date   RIGHT/LEFT HEART CATH AND CORONARY ANGIOGRAPHY N/A 09/08/2021   Procedure: RIGHT/LEFT HEART CATH AND CORONARY ANGIOGRAPHY;  Surgeon: Burnell Blanks, MD;  Location: [facility];  Service: Cardiovascular;  Laterality: N/A;   TEE WITHOUT CARDIOVERSION N/A 09/08/2021   Procedure: TRANSESOPHAGEAL ECHOCARDIOGRAM (TEE);  Surgeon: Josue Hector, MD;  [facility];  Service: Cardiovascular;  Laterality: N/A;   WISDOM TOOTH EXTRACTION  2001    Current Medications: Current Meds  Medication Sig   aspirin EC 81 MG tablet Take 81 mg by mouth daily. Swallow whole.   bisoprolol (ZEBETA) 5 MG tablet Take 0.5 tablets (2.5 mg total) by mouth daily.   furosemide (LASIX) 20 MG tablet Take one tablet daily as needed for a weight over 200 lbs.   Multiple Vitamins-Minerals (MULTIVITAMIN WITH MINERALS) tablet Take 1 tablet by mouth daily.   testosterone cypionate (DEPOTESTOSTERONE CYPIONATE) 200 MG/ML injection Inject into the muscle every 14 (fourteen) days.     Allergies:   Losartan   Social History   Socioeconomic History  Marital status: Legally Separated    Spouse name: Not on file   Number of children: 3   Years of education: Not on file   Highest education level: Not on file  Occupational History   Occupation: Holiday representative  Tobacco Use   Smoking status: Never   Smokeless tobacco: Never  Vaping Use   Vaping Use: Never used  Substance and Sexual Activity   Alcohol use: Yes   Drug use: Never   Sexual activity: Yes  Other Topics Concern   Not on file  Social History  Narrative   Not on file   Social Determinants of Health   Financial Resource Strain: Not on file  Food Insecurity: Not on file  Transportation Needs: Not on file  Physical Activity: Not on file  Stress: Not on file  Social Connections: Not on file     Family History: The patient's family history includes Cancer in his maternal grandmother; Colon cancer in his father and paternal grandfather; Depression in his father and mother; Diabetes in his father; Esophageal cancer in his maternal grandfather; Heart disease in his father; Hyperlipidemia in his father; Hypertension in his father. There is no history of Stomach cancer or Rectal cancer.  ROS:   Please see the history of present illness.     All other systems reviewed and are negative.  EKGs/Labs/Other Studies Reviewed:    The following studies were reviewed today: Echocardiogram 12/15/2021  1. Limited echo to evaluate mitral valve repair   2. Left ventricular ejection fraction, by estimation, is 50 to 55%. The  left ventricle has low normal function. There is mild left ventricular  hypertrophy.   3. The mitral valve has been repaired/replaced. Trivial mitral valve  regurgitation. No evidence of mitral stenosis. The mean mitral valve  gradient is 3.0 mmHg. Peak gradient 5.4 mmHg. There is a prosthetic  annuloplasty ring present in the mitral  position. Procedure Date: 11/11/2021.   4. The aortic valve is tricuspid. Aortic valve regurgitation is not  visualized. No aortic stenosis is present.   EKG:  EKG is ordered today.  It shows normal sinus rhythm, left atrial enlargement, otherwise normal tracing.  QTc 428 ms.  Recent Labs: 05/11/2021: ALT 32 09/06/2021: BUN 15; Creatinine, Ser 0.85; Platelets 258 09/08/2021: Hemoglobin 14.3; Potassium 4.0; Sodium 141  Recent Lipid Panel    Component Value Date/Time   CHOL 154 05/11/2021 1026   TRIG 83.0 05/11/2021 1026   HDL 51.10 05/11/2021 1026   CHOLHDL 3 05/11/2021 1026   VLDL  16.6 05/11/2021 1026   LDLCALC 86 05/11/2021 1026   LDLCALC 76 05/18/2020 1034     Risk Assessment/Calculations:           Physical Exam:    VS:  BP 122/74 (BP Location: Left Arm, Patient Position: Sitting, Cuff Size: Large)   Pulse 75   Ht '5\' 11"'$  (1.803 m)   Wt 218 lb (98.9 kg)   SpO2 91%   BMI 30.40 kg/m     Wt Readings from Last 3 Encounters:  05/03/22 218 lb (98.9 kg)  01/27/22 214 lb (97.1 kg)  01/26/22 207 lb 12.8 oz (94.3 kg)    General: Alert, oriented x3, no distress, appears well Head: no evidence of trauma, PERRL, EOMI, no exophtalmos or lid lag, no myxedema, no xanthelasma; normal ears, nose and oropharynx Neck: normal jugular venous pulsations and no hepatojugular reflux; brisk carotid pulses without delay and no carotid bruits Chest: clear to auscultation, no signs of consolidation  by percussion or palpation, normal fremitus, symmetrical and full respiratory excursions Cardiovascular: normal position and quality of the apical impulse, regular rhythm, normal first and second heart sounds, no murmurs, rubs or gallops Abdomen: no tenderness or distention, no masses by palpation, no abnormal pulsatility or arterial bruits, normal bowel sounds, no hepatosplenomegaly Extremities: no clubbing, cyanosis or edema; 2+ radial, ulnar and brachial pulses bilaterally; 2+ right femoral, posterior tibial and dorsalis pedis pulses; 2+ left femoral, posterior tibial and dorsalis pedis pulses; no subclavian or femoral bruits Neurological: grossly nonfocal Psych: Normal mood and affect    ASSESSMENT:    1. Chronic diastolic heart failure ([place])   2. S/P MVR (mitral valve repair)   3. Mild obesity      PLAN:    In order of problems listed above:  S/P MVRepair: Excellent technical result.  Mildly decreased LVEF postop consistent with longstanding severe MR.  Reminded him of the need for endocarditis prevention with dental procedures (amoxicillin 2 g 1 hour before dental  procedures that involve bleeding such as root canals, tooth extractions, apical resection, etc.).Marland Kitchen   CHF with mildly reduced EF: NYHA class I. Postop EF 50% (49% x 3 D echo), consistent with what was probably longstanding severe MR before repair.  NYHA functional class I, uses loop diuretics occasionally.  This is not for shortness of breath, but what he perceives as reduced urine output.  Overall I think he will do well, but remains at risk for heart failure with excessive sodium load or concurrent severe illnesses.  We discussed the importance of a sodium restricted diet, regular exercise, weight monitoring, signs and symptoms of heart failure exacerbation. We will switch from metoprolol to bisoprolol, as most cardioselective beta-blocker.  Had urticaria with ARB.  I do not think SGLT2 inhibitors or mineralocorticoid antagonist are justified in this asymptomatic patient.   Obesity: Advise weight loss.   Medication Adjustments/Labs and Tests Ordered: Current medicines are reviewed at length with the patient today.  Concerns regarding medicines are outlined above.  Orders Placed This Encounter  Procedures   EKG 12-Lead   No orders of the defined types were placed in this encounter.    Patient Instructions  Medication Instructions:  No changes *If you need a refill on your cardiac medications before your next appointment, please call your pharmacy*   Lab Work: None ordered If you have labs (blood work) drawn today and your tests are completely normal, you will receive your results only by: [place] (if you have MyChart) OR A paper copy in the mail If you have any lab test that is abnormal or we need to change your treatment, we will call you to review the results.   Testing/Procedures: None ordered   Follow-Up: At [facility], you and your health needs are our priority.  As part of our continuing mission to provide you with exceptional heart care, we have created designated  Provider Care Teams.  These Care Teams include your primary Cardiologist (physician) and Advanced Practice Providers (APPs -  Physician Assistants and Nurse Practitioners) who all work together to provide you with the care you need, when you need it.  We recommend signing up for the patient portal called "MyChart".  Sign up information is provided on this After Visit Summary.  MyChart is used to connect with patients for Virtual Visits (Telemedicine).  Patients are able to view lab/test results, encounter notes, upcoming appointments, etc.  Non-urgent messages can be sent to your provider as well.  To learn more about what you can do with MyChart, go to [email or web address].    Your next appointment:   12 month(s)  The format for your next appointment:   In Person  Provider:   Sanda Klein, MD {    Important Information About Sugar         Signed, Frederick Klein, MD  05/03/2022 8:51 AM    [place]

## 2022-05-03 NOTE — Patient Instructions (Signed)

## 2022-05-10 ENCOUNTER — Encounter (INDEPENDENT_AMBULATORY_CARE_PROVIDER_SITE_OTHER): Payer: Self-pay

## 2022-06-03 ENCOUNTER — Other Ambulatory Visit: Payer: Self-pay | Admitting: Cardiovascular Disease

## 2022-06-07 ENCOUNTER — Encounter: Payer: Self-pay | Admitting: Cardiovascular Disease

## 2022-06-07 MED ORDER — FUROSEMIDE 20 MG PO TABS
ORAL_TABLET | ORAL | 3 refills | Status: DC
Start: 2022-06-07 — End: 2022-07-18

## 2022-06-26 ENCOUNTER — Encounter: Payer: Self-pay | Admitting: *Deleted

## 2022-07-17 ENCOUNTER — Other Ambulatory Visit: Payer: Self-pay | Admitting: Cardiovascular Disease

## 2022-08-03 ENCOUNTER — Encounter: Payer: Self-pay | Admitting: Family Medicine

## 2022-08-03 ENCOUNTER — Ambulatory Visit (INDEPENDENT_AMBULATORY_CARE_PROVIDER_SITE_OTHER): Payer: 59 | Admitting: Family Medicine

## 2022-08-03 VITALS — BP 114/78 | HR 87 | Temp 98.3°F | Ht 71.0 in | Wt 220.8 lb

## 2022-08-03 DIAGNOSIS — Z9889 Other specified postprocedural states: Secondary | ICD-10-CM | POA: Diagnosis not present

## 2022-08-03 DIAGNOSIS — I5032 Chronic diastolic (congestive) heart failure: Secondary | ICD-10-CM

## 2022-08-03 DIAGNOSIS — L249 Irritant contact dermatitis, unspecified cause: Secondary | ICD-10-CM | POA: Diagnosis not present

## 2022-08-03 DIAGNOSIS — Z800 Family history of malignant neoplasm of digestive organs: Secondary | ICD-10-CM

## 2022-08-03 DIAGNOSIS — Z0000 Encounter for general adult medical examination without abnormal findings: Secondary | ICD-10-CM

## 2022-08-03 DIAGNOSIS — I10 Essential (primary) hypertension: Secondary | ICD-10-CM

## 2022-08-03 DIAGNOSIS — Z23 Encounter for immunization: Secondary | ICD-10-CM | POA: Diagnosis not present

## 2022-08-03 LAB — CBC WITH DIFFERENTIAL/PLATELET
Basophils Absolute: 0.1 10*3/uL (ref 0.0–0.1)
Basophils Relative: 0.9 % (ref 0.0–3.0)
Eosinophils Absolute: 0.3 10*3/uL (ref 0.0–0.7)
Eosinophils Relative: 4.5 % (ref 0.0–5.0)
HCT: 49.8 % (ref 39.0–52.0)
Hemoglobin: 16.3 g/dL (ref 13.0–17.0)
Lymphocytes Relative: 19.5 % (ref 12.0–46.0)
Lymphs Abs: 1.4 10*3/uL (ref 0.7–4.0)
MCHC: 32.7 g/dL (ref 30.0–36.0)
MCV: 86.7 fl (ref 78.0–100.0)
Monocytes Absolute: 0.9 10*3/uL (ref 0.1–1.0)
Monocytes Relative: 13 % — ABNORMAL HIGH (ref 3.0–12.0)
Neutro Abs: 4.5 10*3/uL (ref 1.4–7.7)
Neutrophils Relative %: 62.1 % (ref 43.0–77.0)
Platelets: 276 10*3/uL (ref 150.0–400.0)
RBC: 5.74 Mil/uL (ref 4.22–5.81)
RDW: 13.8 % (ref 11.5–15.5)
WBC: 7.2 10*3/uL (ref 4.0–10.5)

## 2022-08-03 LAB — COMPREHENSIVE METABOLIC PANEL
ALT: 29 U/L (ref 0–53)
AST: 23 U/L (ref 0–37)
Albumin: 4.2 g/dL (ref 3.5–5.2)
Alkaline Phosphatase: 23 U/L — ABNORMAL LOW (ref 39–117)
BUN: 11 mg/dL (ref 6–23)
CO2: 28 mEq/L (ref 19–32)
Calcium: 9.4 mg/dL (ref 8.4–10.5)
Chloride: 101 mEq/L (ref 96–112)
Creatinine, Ser: 1.01 mg/dL (ref 0.40–1.50)
GFR: 87.39 mL/min (ref >60.00)
Glucose, Bld: 81 mg/dL (ref 70–99)
Potassium: 4.6 mEq/L (ref 3.5–5.1)
Sodium: 138 mEq/L (ref 135–145)
Total Bilirubin: 0.7 mg/dL (ref 0.2–1.2)
Total Protein: 6.7 g/dL (ref 6.0–8.3)

## 2022-08-03 LAB — LIPID PANEL
Cholesterol: 132 mg/dL (ref 0–200)
HDL: 43.1 mg/dL (ref >39.00)
LDL Cholesterol: 65 mg/dL (ref 0–99)
NonHDL: 88.88
Total CHOL/HDL Ratio: 3
Triglycerides: 121 mg/dL (ref 0.0–149.0)
VLDL: 24.2 mg/dL (ref 0.0–40.0)

## 2022-08-03 LAB — TSH: TSH: 1.86 u[IU]/mL (ref 0.35–5.50)

## 2022-08-03 MED ORDER — TRIAMCINOLONE ACETONIDE 0.1 % EX CREA: 1.0000 | TOPICAL_CREAM | Freq: Two times a day (BID) | CUTANEOUS | 0 refills | Status: AC

## 2022-08-03 NOTE — Patient Instructions (Signed)
Please return in 12 months for your annual complete physical; please come fasting.  ? ?I will release your lab results to you on your MyChart account with further instructions. You may see the results before I do, but when I review them I will send you a message with my report or have my assistant call you if things need to be discussed. Please reply to my message with any questions. Thank you!  ? ?If you have any questions or concerns, please don't hesitate to send me a message via MyChart or call the office at [phone]. Thank you for visiting with us today! It's our pleasure caring for you.  ? ?Please do these things to maintain good health! ? ?Exercise at least 30-45 minutes a day,  4-5 days a week.  ?Eat a low-fat diet with lots of fruits and vegetables, up to 7-9 servings per day. ?Drink plenty of water daily. Try to drink 8 8oz glasses per day. ?Seatbelts can save your life. Always wear your seatbelt. ?Place Smoke Detectors on every level of your home and check batteries every year. ?Eye Doctor - have an eye exam every 1-2 years ?Safe sex - use condoms to protect yourself from STDs if you could be exposed to these types of infections. ?Avoid heavy alcohol use. If you drink, keep it to less than 2 drinks/day and not every day. ?Health Care Power of Attorney.  Choose someone you trust that could speak for you if you became unable to speak for yourself. ?Depression is common in our stressful world.If you're feeling down or losing interest in things you normally enjoy, please come in for a visit.  ?

## 2022-08-03 NOTE — Progress Notes (Signed)
Subjective  Chief Complaint  Patient presents with   Annual Exam    Pt here for Annual exam and is not currently fasting     HPI: Frederick Martin is a 51 y.o. male who presents to [facility] today for a Male Wellness Visit. He also has the concerns and/or needs as listed above in the chief complaint. These will be addressed in addition to the Health Maintenance Visit.   Wellness Visit: annual visit with health maintenance review and exam   Health maintenance: Eligible for flu shot today.  Colon cancer screening is up-to-date.  Living healthy lifestyle.  Walks 1 to 2 miles daily.  Tries to eat well.  Body mass index is 30.8 kg/m. Wt Readings from Last 3 Encounters:  08/03/22 220 lb 12.8 oz (100.2 kg)  05/03/22 218 lb (98.9 kg)  01/27/22 214 lb (97.1 kg)     Chronic disease management visit and/or acute problem visit: Hypertension: Takes low-dose Bystolic and as well.  2.5 daily.  Heart rate has improved.  No symptoms of palpitations or chest pain. Post mitral valve repair: Stable per cardiology CHF: Takes Lasix about 3 times weekly.  Chronic diastolic.  No recent exacerbations.  No shortness of breath. Complains of rash make bilateral underarms.  Had telehealth visit and was treated with ketoconazole.  Helped significantly but not resolved.  Rash is barely visible at this point but complains of itching.  He is not on an excessive sweater.  No other rashes or systemic symptoms  Patient Active Problem List   Diagnosis Date Noted   Essential hypertension 09/27/2020   Family history of colon cancer in father 04/05/2018   Male hypogonadism 08/06/2019   Gastroesophageal reflux disease 03/07/2017   Mild intermittent asthma without complication 44/96/7591   Erectile dysfunction 06/28/2016   MVP (mitral valve prolapse) 03/21/2016   Allergic rhinitis 07/24/2011   CHF (congestive heart failure), NYHA class II, chronic, diastolic ([place]) 63/84/6659   S/P MVR  (mitral valve repair) 11/11/2021   Health Maintenance  Topic Date Due   COVID-19 Vaccine (3 - Pfizer series) 08/19/2022 (Originally 12/10/2020)   COLONOSCOPY (Pts 45-65yr Insurance coverage will need to be confirmed)  06/29/2024   TETANUS/TDAP  01/28/2032   INFLUENZA VACCINE  Completed   Hepatitis C Screening  Completed   HIV Screening  Completed   HPV VACCINES  Aged Out   Immunization History  Administered Date(s) Administered   Influenza Split 07/11/2013   Influenza, Quadrivalent, Recombinant, Inj, Pf 08/13/2016   Influenza,inj,Quad PF,6+ Mos 11/09/2018, 06/28/2020, 08/03/2022   Influenza,inj,quad, With Preservative 07/28/2017   Influenza-Unspecified 09/14/2021   Moderna SARS-COV2 Booster Vaccination 10/15/2020   PFIZER(Purple Top)SARS-COV-2 Vaccination 12/07/2019, 01/07/2020   Pneumococcal Polysaccharide-23 04/05/2018   Tdap 04/02/2011, 01/27/2022   We updated and reviewed the patient's past history in detail and it is documented below. Allergies: Patient is allergic to losartan. Past Medical History  has a past medical history of Asthma, GERD (gastroesophageal reflux disease), Heart murmur, Hypertension, and MVP (mitral valve prolapse). Past Surgical History Patient  has a past surgical history that includes Wisdom tooth extraction (2001); RIGHT/LEFT HEART CATH AND CORONARY ANGIOGRAPHY (N/A, 09/08/2021); and TEE without cardioversion (N/A, 09/08/2021). Social History Patient  reports that he has never smoked. He has never used smokeless tobacco. He reports current alcohol use. He reports that he does not use drugs. Family History family history includes Cancer in his maternal grandmother; Colon cancer in his father and paternal grandfather; Depression in his father  and mother; Diabetes in his father; Esophageal cancer in his maternal grandfather; Heart disease in his father; Hyperlipidemia in his father; Hypertension in his father. Review of Systems: Constitutional: negative for  fever or malaise Ophthalmic: negative for photophobia, double vision or loss of vision Cardiovascular: negative for chest pain, dyspnea on exertion, or new LE swelling Respiratory: negative for SOB or persistent cough Gastrointestinal: negative for abdominal pain, change in bowel habits or melena Genitourinary: negative for dysuria or gross hematuria Musculoskeletal: negative for new gait disturbance or muscular weakness Integumentary: negative for new or persistent rashes Neurological: negative for TIA or stroke symptoms Psychiatric: negative for SI or delusions Allergic/Immunologic: negative for hives  Patient Care Team    Relationship Specialty Notifications Start End  Leamon Arnt, MD PCP - General Family Medicine  05/18/20   Croitoru, Dani Gobble, MD PCP - Cardiology Cardiology  08/24/21   Thornton Park, MD Consulting Physician Gastroenterology  05/18/20   Butch Penny, MD Referring Physician Urology  05/18/20    Objective  Vitals: BP 114/78   Pulse 87   Temp 98.3 F (36.8 C)   Ht '5\' 11"'$  (1.803 m)   Wt 220 lb 12.8 oz (100.2 kg)   SpO2 97%   BMI 30.80 kg/m  General:  Well developed, well nourished, no acute distress  Psych:  Alert and orientedx3,normal mood and affect HEENT:  Normocephalic, atraumatic, non-icteric sclera, PERRL, oropharynx is clear without mass or exudate, supple neck without adenopathy, mass or thyromegaly Cardiovascular:  Normal S1, S2, RRR without gallop, rub or murmur, nondisplaced PMI, +2 distal pulses in bilateral upper and lower extremities. Respiratory:  Good breath sounds bilaterally, CTAB with normal respiratory effort Gastrointestinal: normal bowel sounds, soft, non-tender, no noted masses. No HSM MSK: no deformities, contusions. Joints are without erythema or swelling. Spine and CVA region are nontender Skin:  Warm, no suspicious lesions noted, bilateral axilla mostly clear with tan smooth rash without flaking Neurologic:    Mental status is  normal. CN 2-11 are normal. Gross motor and sensory exams are normal. Stable gait. No tremor GU: No inguinal hernias or adenopathy are appreciated bilaterally   Assessment  1. Annual physical exam   2. Essential hypertension   3. CHF (congestive heart failure), NYHA class II, chronic, diastolic ([place])   4. S/P MVR (mitral valve repair)   5. Family history of colon cancer in father   106. Irritant dermatitis   7. Need for immunization against influenza      Plan  Male Wellness Visit: Age appropriate Health Maintenance and Prevention measures were discussed with patient. Included topics are cancer screening recommendations, ways to keep healthy (see AVS) including dietary and exercise recommendations, regular eye and dental care, use of seat belts, and avoidance of moderate alcohol use and tobacco use.  BMI: discussed patient's BMI and encouraged positive lifestyle modifications to help get to or maintain a target BMI. HM needs and immunizations were addressed and ordered. See below for orders. See HM and immunization section for updates.  Flu shot today Routine labs and screening tests ordered including cmp, cbc and lipids where appropriate. Discussed recommendations regarding Vit D and calcium supplementation (see AVS)  Chronic disease f/u and/or acute problem visit: (deemed necessary to be done in addition to the wellness visit): Hypertension is well controlled continue Bystolic 2.5 mg daily. CHF is stable on Lasix as needed Possible irritant dermatitis: Stop ketoconazole.  Trial of triamcinolone cream.  Change deodorants.  Follow-up if not improving  Follow up: 12 months  for complete physical [place] side effects, risks, benefits, and alternatives for medications and treatment plan prescribed today were discussed, and the patient expressed understanding of the given instructions. Patient is instructed to call or message via MyChart if he/she has any questions or concerns regarding our  treatment plan. No barriers to understanding were identified. We discussed Red Flag symptoms and signs in detail. Patient expressed understanding regarding what to do in case of urgent or emergency type symptoms.  Medication list was reconciled, printed and provided to the patient in AVS. Patient instructions and summary information was reviewed with the patient as documented in the AVS. This note was prepared with assistance of Dragon voice recognition software. Occasional wrong-word or sound-a-like substitutions may have occurred due to the inherent limitations of voice recognition software    Orders Placed This Encounter  Procedures   Flu Vaccine QUAD 35moIM (Fluarix, Fluzone & Alfiuria Quad PF)   CBC with Differential/Platelet   Comprehensive metabolic panel   Lipid panel   TSH   Meds ordered this encounter  Medications   triamcinolone cream (KENALOG) 0.1 %    Sig: Apply 1 Application topically 2 (two) times daily. For 2 weeks, then as needed    Dispense:  45 g    Refill:  0

## 2022-10-16 IMAGING — CT CT ANGIO CHEST
3 of 11 series · 17 of 36 positions shown · IV contrast (Omnipaque)
Comparison: PA and lateral chest earlier today.

CLINICAL DATA: Chest tightness and shortness of breath for 2 weeks.

EXAM:
CT ANGIOGRAPHY CHEST WITH CONTRAST
TECHNIQUE: Multidetector CT imaging of the chest was performed using the
standard protocol during bolus administration of intravenous
contrast. Multiplanar CT image reconstructions and MIPs were
obtained to evaluate the vascular anatomy.
CONTRAST:  100mL OMNIPAQUE IOHEXOL 350 MG/ML SOLN

[Series 7: pe lung · axial · 0.98mm/px · z∈[-271,-190]mm · 2 of 81 slices shown]
[im 27/81  mediastinal]
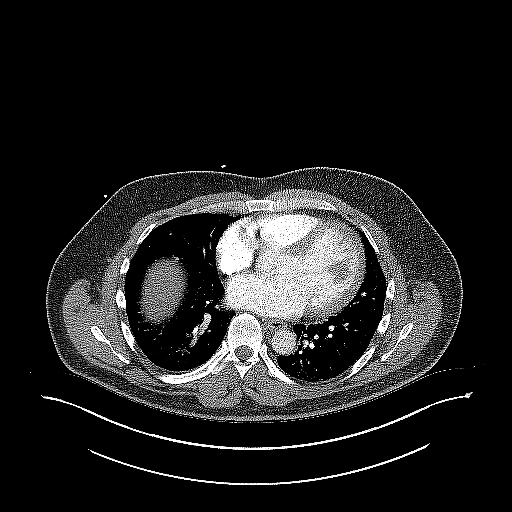
[im 54/81  mediastinal]
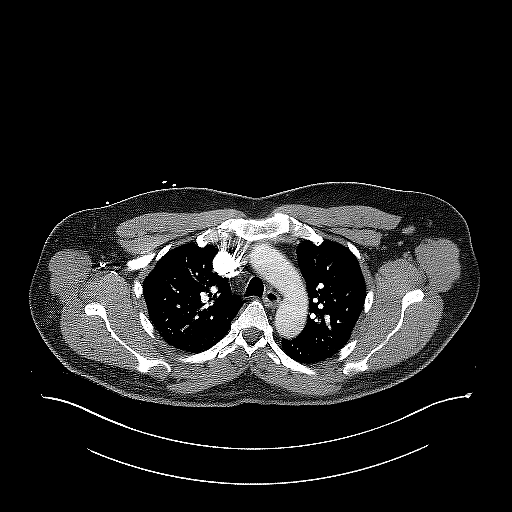

[Series 8: pe thins · axial · 0.98mm/px · z∈[-358,-127]mm · 14 of 267 slices shown]
[im 18/267  lung]
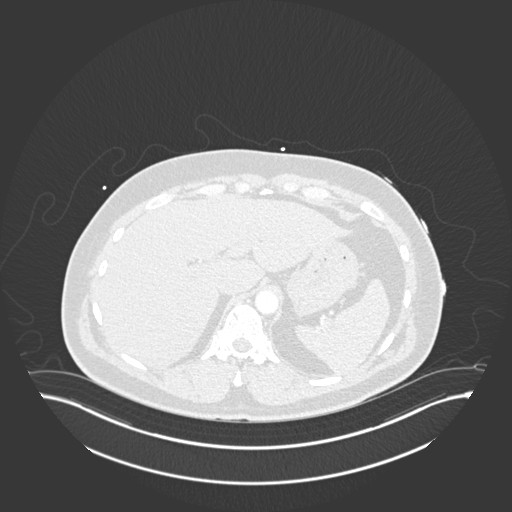
[im 36/267  mediastinal]
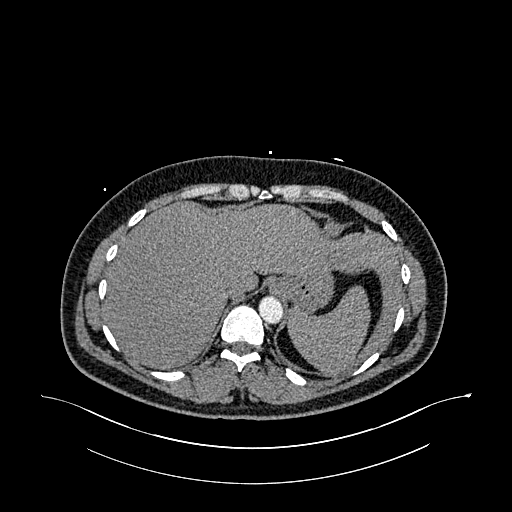
[im 54/267  lung]
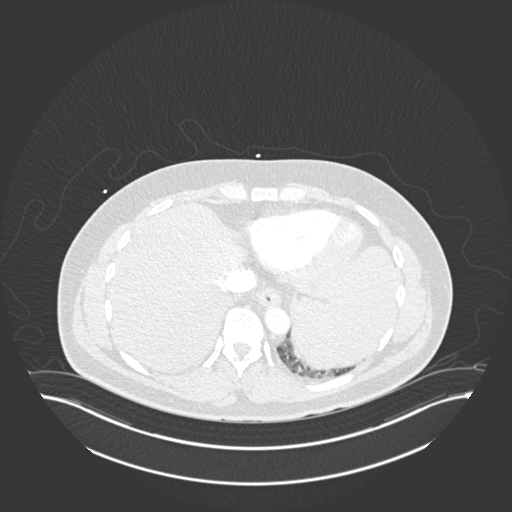
[im 71/267  mediastinal]
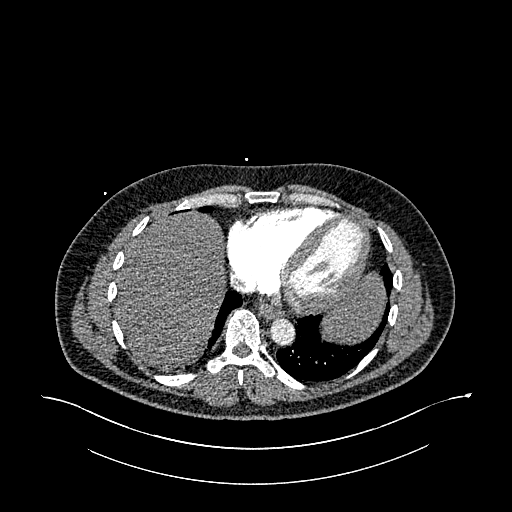
[im 89/267  lung]
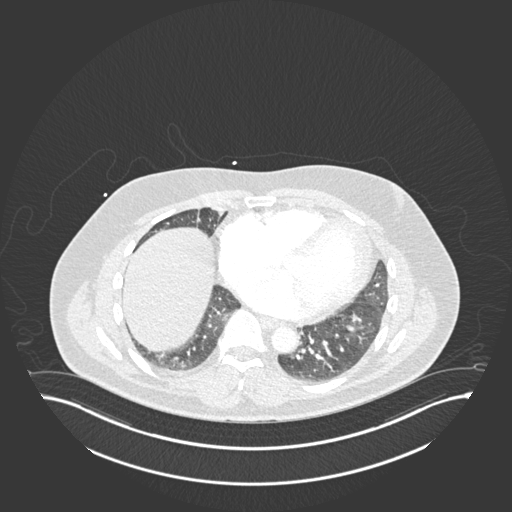
[im 107/267  mediastinal]
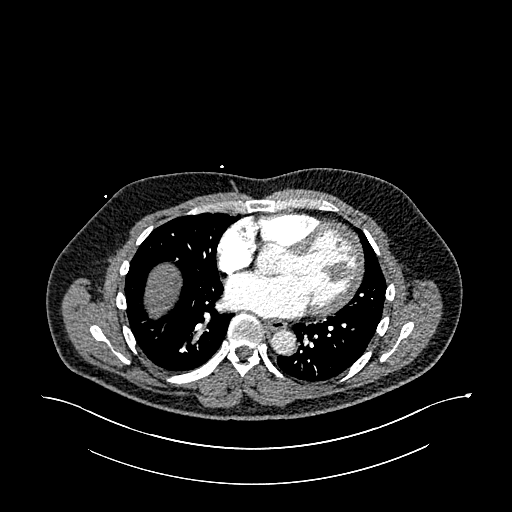
[im 125/267  lung]
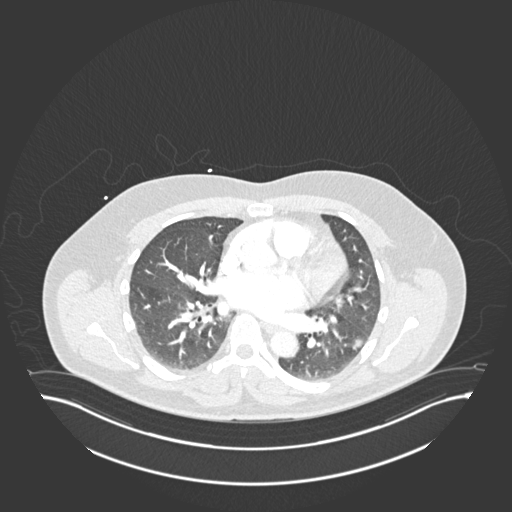
[im 142/267  mediastinal]
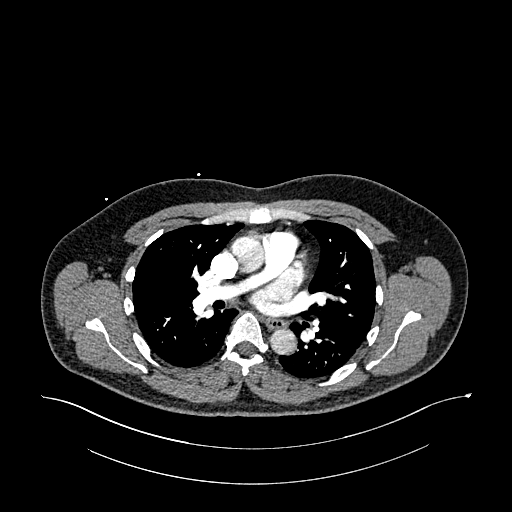
[im 160/267  lung]
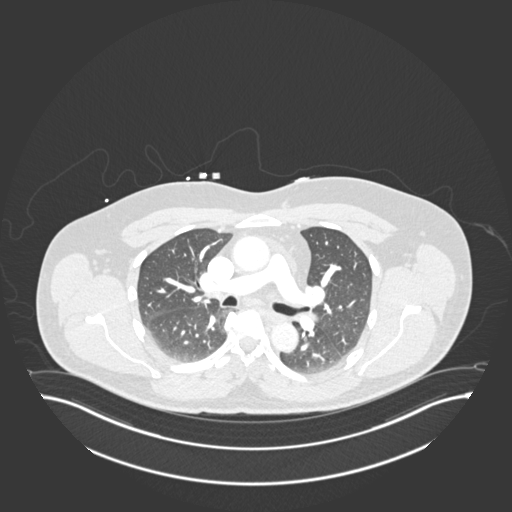
[im 178/267  mediastinal]
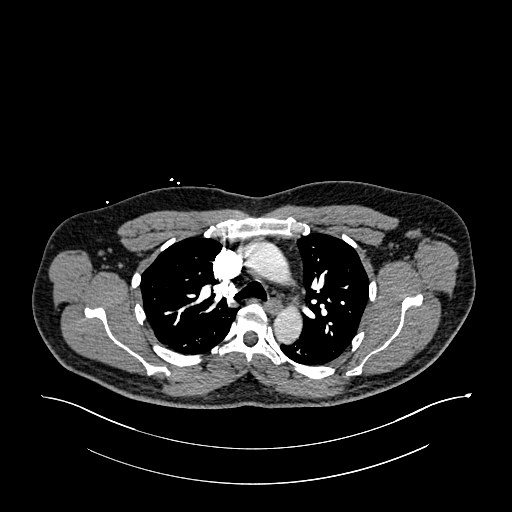
[im 196/267  lung]
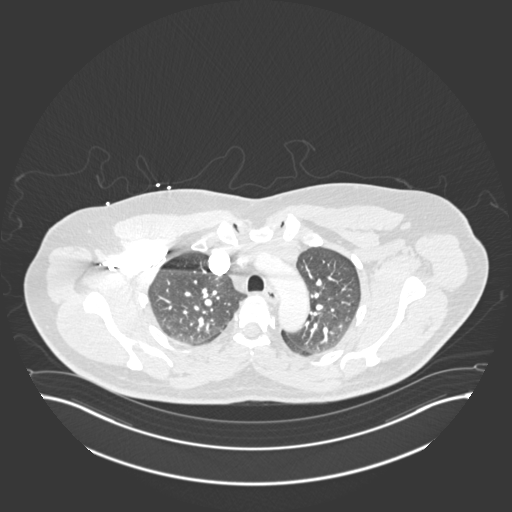
[im 213/267  mediastinal]
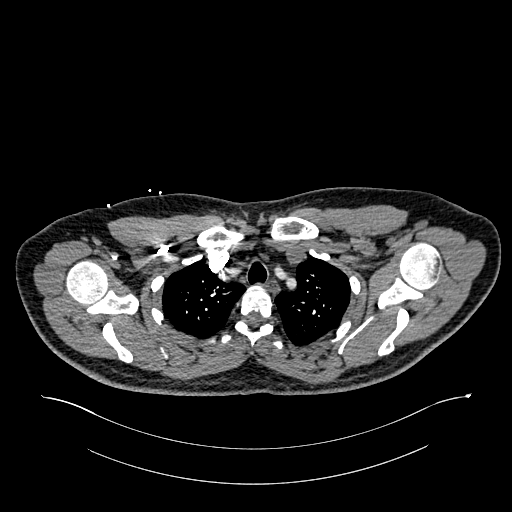
[im 231/267  lung]
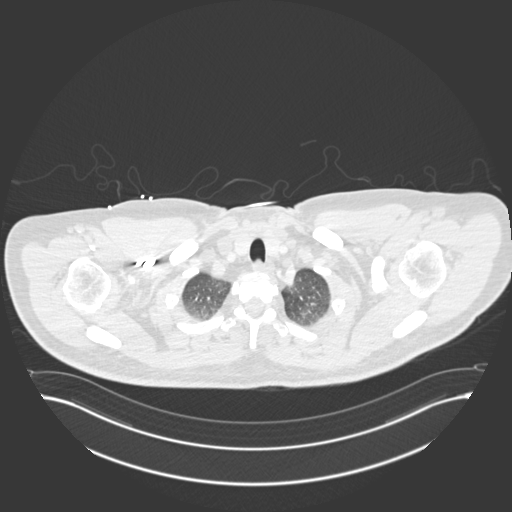
[im 249/267  mediastinal]
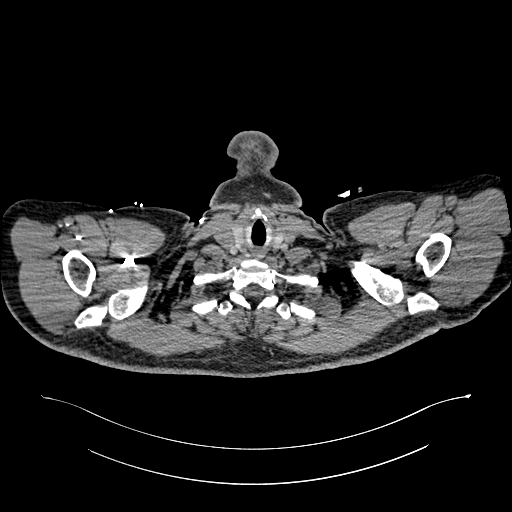

[Series 9: pe coronal mpr · coronal · 0.59mm/px · 1 of 151 slices shown]
[im 76/151  mediastinal]
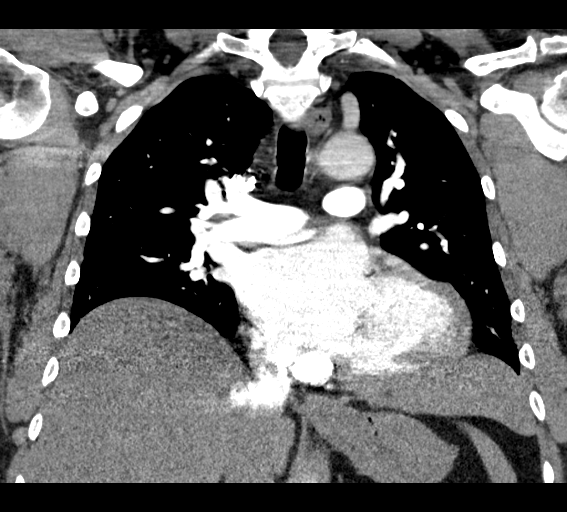

[17 of 36 positions shown; findings below may reference images not displayed]

FINDINGS: Cardiovascular: There is mild cardiomegaly with a left chamber
predominance. The pulmonary arteries are normal in caliber and free
of thrombus through the segmental divisions with subsegmental
arteries poorly evaluated due to breathing motion and bolus timing.
The aorta and great vessels are normal in caliber and enhancement.
There is no aortic dissection, plaques or hematoma. There is a small
superior recess pericardial effusion. There is no significant venous
distension or evidence of acute pulmonary edema.

Mediastinum/Nodes: No enlarged mediastinal, hilar, or axillary lymph
nodes. Thyroid gland, trachea, and esophagus demonstrate no
significant findings.

Lungs/Pleura: There are symmetric bilateral trace pleural effusions.
There is a rounded 1.2 cm partially pleural based opacity laterally
in the superior segment of the left lower lobe which could represent
a nodular infiltrate or true pulmonary nodule. There is mild
elevation right hemidiaphragm. The lungs are otherwise clear.

Upper Abdomen: No acute abnormality.

Musculoskeletal: No chest wall abnormality. No acute or significant
osseous findings. Mild thoracic spondylosis.

Review of the MIP images confirms the above findings.
IMPRESSION: 1. Mild cardiomegaly but no acute chest CTA findings. The pulmonary
arteries are clear through the segmental divisions with suboptimal
evaluation of the subsegmental arterial bed.
2. 1.2 cm nodule versus inflammatory nodule in the left lower lobe
superior segment. PET-CT or three-month follow-up CT recommended.
3. Bilateral trace pleural effusions but no findings of acute
pulmonary edema, no significant pulmonary venous ectasia

## 2023-02-04 ENCOUNTER — Other Ambulatory Visit: Payer: Self-pay | Admitting: Cardiovascular Disease

## 2023-02-20 ENCOUNTER — Encounter: Payer: Self-pay | Admitting: Family Medicine

## 2023-02-27 ENCOUNTER — Ambulatory Visit (INDEPENDENT_AMBULATORY_CARE_PROVIDER_SITE_OTHER): Payer: 59 | Admitting: Family Medicine

## 2023-02-27 ENCOUNTER — Encounter: Payer: Self-pay | Admitting: Family Medicine

## 2023-02-27 VITALS — BP 110/76 | HR 76 | Temp 98.2°F | Ht 71.0 in | Wt 218.4 lb

## 2023-02-27 DIAGNOSIS — H532 Diplopia: Secondary | ICD-10-CM | POA: Diagnosis not present

## 2023-02-27 NOTE — Progress Notes (Signed)
Subjective  CC:  Chief Complaint  Patient presents with   Eye Problem    Pt stated that he has been having double vision and headaches for the past year but not consistent     HPI: Frederick Martin is a 52 y.o. male who presents to the office today to address the problems listed above in the chief complaint. 52 year old male here with complaints of double vision.  I reviewed his ophthalmology notes.  His ophthalmology exam was unremarkable.  See scanned in reports in media.  He reports that off-and-on for several months he had an episode of double vision but over the last several weeks, he has had almost daily episodes of what he describes as diagonal double vision.  Happens when both eyes are open, resolves when he closes one of his eyes.  Denies pain or redness.  He has had episodes of mild frontal headaches, no temporal headaches.  No pain with chewing or combing his hair.  He also reports that his prescription for his eyeglasses has changed.  He thought the 2 may be related.  He denies any other neurologic complaints.  No weakness.  No fatigability.  His double vision lasts about 5 to 10 minutes then resolves.  His girlfriend has evaluated him during the episode and denies any problems or noticeable problem with his gaze at the time.  Assessment  1. Binocular vision disorder with diplopia      Plan  Binocular diplopia: Not reproducible.  Unclear if related to cranial nerve palsy but differential includes multiple other etiologies.  Given normal exam today, will refer to neurology for further evaluation.  Patient agrees  Medical decision making: Reviewed chart, comorbidities, recent ophthalmology report, and considered brain MRI versus consultation. Follow up: For complete physical 08/07/2023  Orders Placed This Encounter  Procedures   Ambulatory referral to Neurology   No orders of the defined types were placed in this encounter.     I reviewed the patients updated PMH, FH, and  SocHx.    Patient Active Problem List   Diagnosis Date Noted   Essential hypertension 09/27/2020    Priority: High   Family history of colon cancer in father 04/05/2018    Priority: High   Male hypogonadism 08/06/2019    Priority: Medium    Gastroesophageal reflux disease 03/07/2017    Priority: Medium    Mild intermittent asthma without complication 03/07/2017    Priority: Medium    Erectile dysfunction 06/28/2016    Priority: Medium    MVP (mitral valve prolapse) 03/21/2016    Priority: Medium    Allergic rhinitis 07/24/2011    Priority: Low   CHF (congestive heart failure), NYHA class II, chronic, diastolic (HCC) 01/26/2022   S/P MVR (mitral valve repair) 11/11/2021   Current Meds  Medication Sig   bisoprolol (ZEBETA) 5 MG tablet TAKE ONE-HALF TABLET BY MOUTH  DAILY   furosemide (LASIX) 20 MG tablet TAKE 1 TABLET BY MOUTH DAILY AS  NEEDED FOR A WEIGHT OVER 200 LBS   Multiple Vitamins-Minerals (MULTIVITAMIN WITH MINERALS) tablet Take 1 tablet by mouth daily.   sildenafil (REVATIO) 20 MG tablet Take 1 to 5 tablets as needed   testosterone cypionate (DEPOTESTOSTERONE CYPIONATE) 200 MG/ML injection Inject into the muscle every 14 (fourteen) days.   triamcinolone cream (KENALOG) 0.1 % Apply 1 Application topically 2 (two) times daily. For 2 weeks, then as needed    Allergies: Patient is allergic to losartan. Family History: Patient family history includes Cancer in  his maternal grandmother; Colon cancer in his father and paternal grandfather; Depression in his father and mother; Diabetes in his father; Esophageal cancer in his maternal grandfather; Heart disease in his father; Hyperlipidemia in his father; Hypertension in his father. Social History:  Patient  reports that he has never smoked. He has never used smokeless tobacco. He reports current alcohol use. He reports that he does not use drugs.  Review of Systems: Constitutional: Negative for fever malaise or  anorexia Cardiovascular: negative for chest pain Respiratory: negative for SOB or persistent cough Gastrointestinal: negative for abdominal pain  Objective  Vitals: BP 110/76   Pulse 76   Temp 98.2 F (36.8 C)   Ht 5\' 11"  (1.803 m)   Wt 218 lb 6.4 oz (99.1 kg)   SpO2 95%   BMI 30.46 kg/m  General: no acute distress , A&Ox3 HEENT: PEERLA without diplopia, normal reactive pupils,, conjunctiva normal, neck is supple Neuro: Normal cranial nerve exam  Commons side effects, risks, benefits, and alternatives for medications and treatment plan prescribed today were discussed, and the patient expressed understanding of the given instructions. Patient is instructed to call or message via MyChart if he/she has any questions or concerns regarding our treatment plan. No barriers to understanding were identified. We discussed Red Flag symptoms and signs in detail. Patient expressed understanding regarding what to do in case of urgent or emergency type symptoms.  Medication list was reconciled, printed and provided to the patient in AVS. Patient instructions and summary information was reviewed with the patient as documented in the AVS. This note was prepared with assistance of Dragon voice recognition software. Occasional wrong-word or sound-a-like substitutions may have occurred due to the inherent limitations of voice recognition software

## 2023-03-06 ENCOUNTER — Ambulatory Visit (INDEPENDENT_AMBULATORY_CARE_PROVIDER_SITE_OTHER): Payer: 59 | Admitting: Neurology

## 2023-03-06 ENCOUNTER — Encounter: Payer: Self-pay | Admitting: Neurology

## 2023-03-06 VITALS — BP 116/72 | HR 70 | Ht 71.0 in | Wt 226.0 lb

## 2023-03-06 DIAGNOSIS — H532 Diplopia: Secondary | ICD-10-CM

## 2023-03-06 DIAGNOSIS — R270 Ataxia, unspecified: Secondary | ICD-10-CM

## 2023-03-06 NOTE — Progress Notes (Signed)
[facility]  PATIENT: Frederick Martin DOB: 10/14/1970  REFERRING DOCTOR OR PCP: Asencion Partridge, MD SOURCE: Patient, notes from primary care,  _________________________________   HISTORICAL  CHIEF COMPLAINT:  Chief Complaint  Patient presents with   Room 10    Pt is here Alone. Pt states that his symptoms started 1 year ago. Pt states that he has double vision that come and goes. Pt states that no accidents or falls caused his symptoms. Pt states that he has blurry vision. Pt states that most of his symptoms stem from his right eye. Pt states that he had heart surgery February of last year.     HISTORY OF PRESENT ILLNESS:  I had the pleasure seeing your patient, Frederick Martin, at [facility] for neurologic consultation regarding his diplopia.  He is a 52 year old man who has had fluctuating diplopia since February 2023.   He had a MVR at [facility] and noted dome diplopia a couple weeks later.   He has an episode of diplopia x 5-0 minutes once r twice most days.  A few tines it occurs for hours off/on.     He has not identified any trigger.  These occur more commonly in the afternoons.   The diplopia is skewed.  The diplopia is binocular.  He does notes vision is better OS than OD.    He saw ophthalmology and was told he had a noral exam.     At the same time as the diplopia, he will note balance is reduced and he prefers to sit.  Sometimes he notes tingling in his legs.   He has not had any imaging studies.    Sometimes he experiences headaches but they sem unrelated to the spells for the most part.   However some HA seem to occur if his vision is worse.      He denies DM, HTN.   He denies trauma.  REVIEW OF SYSTEMS: Constitutional: No fevers, chills, sweats, or change in appetite Eyes: No visual changes,.  Double vision as above.  No eye pain Ear, nose and throat: No hearing loss, ear pain, nasal congestion, sore throat Cardiovascular: No chest pain,  palpitations Respiratory:  No shortness of breath at rest or with exertion.   No wheezes GastrointestinaI: No nausea, vomiting, diarrhea, abdominal pain, fecal incontinence Genitourinary:  No dysuria, urinary retention or frequency.  No nocturia. Musculoskeletal:  No neck pain, back pain Integumentary: No rash, pruritus, skin lesions Neurological: as above Psychiatric: No depression at this time.  No anxiety Endocrine: No palpitations, diaphoresis, change in appetite, change in weigh or increased thirst Hematologic/Lymphatic:  No anemia, purpura, petechiae. Allergic/Immunologic: No itchy/runny eyes, nasal congestion, recent allergic reactions, rashes  ALLERGIES: Allergies  Allergen Reactions   Losartan Hives and Itching    HOME MEDICATIONS:  Current Outpatient Medications:    bisoprolol (ZEBETA) 5 MG tablet, TAKE ONE-HALF TABLET BY MOUTH  DAILY, Disp: 45 tablet, Rfl: 3   furosemide (LASIX) 20 MG tablet, TAKE 1 TABLET BY MOUTH DAILY AS  NEEDED FOR A WEIGHT OVER 200 LBS, Disp: 30 tablet, Rfl: 11   Multiple Vitamins-Minerals (MULTIVITAMIN WITH MINERALS) tablet, Take 1 tablet by mouth daily., Disp: , Rfl:    sildenafil (REVATIO) 20 MG tablet, Take 1 to 5 tablets as needed, Disp: 50 tablet, Rfl: 2   testosterone cypionate (DEPOTESTOSTERONE CYPIONATE) 200 MG/ML injection, Inject into the muscle every 14 (fourteen) days., Disp: , Rfl:    triamcinolone cream (KENALOG) 0.1 %, Apply  1 Application topically 2 (two) times daily. For 2 weeks, then as needed, Disp: 45 g, Rfl: 0  PAST MEDICAL HISTORY: Past Medical History:  Diagnosis Date   Asthma    GERD (gastroesophageal reflux disease)    Heart murmur    Hypertension    MVP (mitral valve prolapse)     PAST SURGICAL HISTORY: Past Surgical History:  Procedure Laterality Date   RIGHT/LEFT HEART CATH AND CORONARY ANGIOGRAPHY N/A 09/08/2021   Procedure: RIGHT/LEFT HEART CATH AND CORONARY ANGIOGRAPHY;  Surgeon: Kathleene Hazel, MD;   Location: [facility] INVASIVE CV LAB;  Service: Cardiovascular;  Laterality: N/A;   TEE WITHOUT CARDIOVERSION N/A 09/08/2021   Procedure: TRANSESOPHAGEAL ECHOCARDIOGRAM (TEE);  Surgeon: Wendall Stade, MD;  Location: [facility] ENDOSCOPY;  Service: Cardiovascular;  Laterality: N/A;   WISDOM TOOTH EXTRACTION  2001    FAMILY HISTORY: Family History  Problem Relation Age of Onset   Depression Mother    Depression Father    Diabetes Father    Heart disease Father    Hyperlipidemia Father    Hypertension Father    Colon cancer Father    Cancer Maternal Grandmother    Colon cancer Paternal Grandfather    Esophageal cancer Maternal Grandfather    Stomach cancer Neg Hx    Rectal cancer Neg Hx     SOCIAL HISTORY: Social History   Socioeconomic History   Marital status: Divorced    Spouse name: Not on file   Number of children: 3   Years of education: Not on file   Highest education level: Associate degree: occupational, Scientist, product/process development, or vocational program  Occupational History   Occupation: Associate Professor  Tobacco Use   Smoking status: Never   Smokeless tobacco: Never  Vaping Use   Vaping Use: Never used  Substance and Sexual Activity   Alcohol use: Yes   Drug use: Never   Sexual activity: Yes  Other Topics Concern   Not on file  Social History Narrative   Right Handed   2-3 Cups of Coffee per Day   Soda when he goes out to eat   Social Determinants of Health   Financial Resource Strain: Medium Risk (02/26/2023)   Overall Financial Resource Strain (CARDIA)    Difficulty of Paying Living Expenses: Somewhat hard  Food Insecurity: No Food Insecurity (02/26/2023)   Hunger Vital Sign    Worried About Running Out of Food in the Last Year: Never true    Ran Out of Food in the Last Year: Never true  Transportation Needs: No Transportation Needs (02/26/2023)   PRAPARE - Administrator, Civil Service (Medical): No    Lack of Transportation (Non-Medical): No  Physical  Activity: Insufficiently Active (02/26/2023)   Exercise Vital Sign    Days of Exercise per Week: 2 days    Minutes of Exercise per Session: 30 min  Stress: No Stress Concern Present (02/26/2023)   [employer] of Occupational Health - Occupational Stress Questionnaire    Feeling of Stress : Only a little  Social Connections: Socially Isolated (02/26/2023)   Social Connection and Isolation Panel [NHANES]    Frequency of Communication with Friends and Family: Once a week    Frequency of Social Gatherings with Friends and Family: Twice a week    Attends Religious Services: Never    Database administrator or Organizations: No    Attends Engineer, structural: Not on file    Marital Status: Divorced  Intimate Partner Violence: Not  on file       PHYSICAL EXAM  Vitals:   03/06/23 1250  BP: 116/72  Pulse: 70  Weight: 226 lb (102.5 kg)  Height: 5\' 11"  (1.803 m)    Body mass index is 31.52 kg/m.   General: The patient is well-developed and well-nourished and in no acute distress  HEENT:  Head is Armstrong/AT.  Sclera are anicteric.  Funduscopic exam shows normal optic discs and retinal vessels.  Neck: No carotid bruits are noted.  The neck is nontender.  Cardiovascular: The heart has a regular rate and rhythm with a normal S1 and S2. There were no murmurs, gallops or rubs.    Skin: Extremities are without rash or  edema.  Musculoskeletal:  Back is nontender  Neurologic Exam  Mental status: The patient is alert and oriented x 3 at the time of the examination. The patient has apparent normal recent and remote memory, with an apparently normal attention span and concentration ability.   Speech is normal.  Cranial nerves: Extraocular movements are full. Pupils are equal, round, and reactive to light and accomodation.  Visual fields are full.  Facial symmetry is present. There is good facial sensation to soft touch bilaterally.Facial strength is normal.  Trapezius and  sternocleidomastoid strength is normal. No dysarthria is noted.  The tongue is midline, and the patient has symmetric elevation of the soft palate. No obvious hearing deficits are noted.  Motor:  Muscle bulk is normal.   Tone is normal. Strength is  5 / 5 in all 4 extremities.   Sensory: Sensory testing is intact to pinprick, soft touch and vibration sensation in all 4 extremities.  Coordination: Cerebellar testing reveals good finger-nose-finger and heel-to-shin bilaterally.  Gait and station: Station is normal.   Gait is normal. Tandem gait is wide. Romberg is negative.   Reflexes: Deep tendon reflexes are symmetric and normal bilaterally.   Plantar responses are flexor.    DIAGNOSTIC DATA (LABS, IMAGING, TESTING) - I reviewed patient records, labs, notes, testing and imaging myself where available.  Lab Results  Component Value Date   WBC 7.2 08/03/2022   HGB 16.3 08/03/2022   HCT 49.8 08/03/2022   MCV 86.7 08/03/2022   PLT 276.0 08/03/2022      Component Value Date/Time   NA 138 08/03/2022 0930   K 4.6 08/03/2022 0930   CL 101 08/03/2022 0930   CO2 28 08/03/2022 0930   GLUCOSE 81 08/03/2022 0930   BUN 11 08/03/2022 0930   CREATININE 1.01 08/03/2022 0930   CREATININE 0.93 05/18/2020 1034   CALCIUM 9.4 08/03/2022 0930   PROT 6.7 08/03/2022 0930   ALBUMIN 4.2 08/03/2022 0930   AST 23 08/03/2022 0930   ALT 29 08/03/2022 0930   ALKPHOS 23 (L) 08/03/2022 0930   BILITOT 0.7 08/03/2022 0930   GFRNONAA >60 09/06/2021 2023   Lab Results  Component Value Date   CHOL 132 08/03/2022   HDL 43.10 08/03/2022   LDLCALC 65 08/03/2022   TRIG 121.0 08/03/2022   CHOLHDL 3 08/03/2022   No results found for: "HGBA1C" Lab Results  Component Value Date   VITAMINB12 664 05/18/2020   Lab Results  Component Value Date   TSH 1.86 08/03/2022       ASSESSMENT AND PLAN  Diplopia - Plan: Sedimentation rate, C-reactive protein, Thyroid Panel With TSH, MR BRAIN W WO  CONTRAST  Ataxia - Plan: MR BRAIN W WO CONTRAST   In summary, Mr. Pati is a 52 year old man who has  had intermittent diplopia.  The etiology is uncertain.  He actually has a normal physical examination today except for mild reduced tandem walking..    I will check blood work for myasthenia gravis, thyroid disease, vasculitis and also check an MRI of the brain to further evaluate.  I did discuss that much of the time when symptoms are fluctuating that we can look for the etiology but may not be able to determine with reasonable certainty.  Because symptoms generally tend to last for 5 to 10 minutes, he could patch at work if an episode occurs to allow him to see the monitor better.  He will return to see me as needed based on the results of the study.  Thank you for asking me to see Mr. Moody.  Please let me know if I can be of further assistance with him or other patients in the future.  Shalana Jardin A. Epimenio Foot, MD, [facility] 03/06/2023, 1:12 PM Certified in Neurology, Clinical Neurophysiology, Sleep Medicine and Neuroimaging  [facility] [address] [place], [place] [address] [phone]

## 2023-03-07 ENCOUNTER — Encounter: Payer: Self-pay | Admitting: Family Medicine

## 2023-03-07 DIAGNOSIS — H532 Diplopia: Secondary | ICD-10-CM | POA: Insufficient documentation

## 2023-03-07 LAB — THYROID PANEL WITH TSH

## 2023-03-07 LAB — MYASTHENIA GRAVIS PROFILE: Anti-striation Abs: NEGATIVE

## 2023-03-08 LAB — THYROID PANEL WITH TSH: Free Thyroxine Index: 2.2 (ref 1.2–4.9)

## 2023-03-12 LAB — MYASTHENIA GRAVIS PROFILE: AChR Binding Ab, Serum: <0.03 nmol/L (ref 0.00–0.24)

## 2023-03-12 LAB — THYROID PANEL WITH TSH: T4, Total: 7 ug/dL (ref 4.5–12.0)

## 2023-03-14 ENCOUNTER — Ambulatory Visit: Payer: 59

## 2023-03-14 DIAGNOSIS — H532 Diplopia: Secondary | ICD-10-CM

## 2023-03-14 DIAGNOSIS — R270 Ataxia, unspecified: Secondary | ICD-10-CM | POA: Diagnosis not present

## 2023-03-14 MED ORDER — GADOBENATE DIMEGLUMINE 529 MG/ML IV SOLN
20.0000 mL | Freq: Once | INTRAVENOUS | Status: AC | PRN
  Administered 2023-03-14: 20 mL via INTRAVENOUS

## 2023-03-25 LAB — MYASTHENIA GRAVIS PROFILE

## 2023-03-25 LAB — MUSK ANTIBODIES: MuSK Antibodies: <1 U/mL

## 2023-03-25 LAB — C-REACTIVE PROTEIN: CRP: 2 mg/L (ref 0–10)

## 2023-03-25 LAB — THYROID PANEL WITH TSH: T3 Uptake Ratio: 31 % (ref 24–39)

## 2023-03-25 LAB — SEDIMENTATION RATE: Sed Rate: 2 mm/hr (ref 0–15)

## 2023-05-27 ENCOUNTER — Other Ambulatory Visit: Payer: Self-pay | Admitting: Cardiovascular Disease

## 2023-08-07 ENCOUNTER — Ambulatory Visit (INDEPENDENT_AMBULATORY_CARE_PROVIDER_SITE_OTHER): Payer: 59 | Admitting: Family Medicine

## 2023-08-07 ENCOUNTER — Encounter: Payer: Self-pay | Admitting: Family Medicine

## 2023-08-07 VITALS — BP 128/78 | HR 75 | Temp 97.9°F | Ht 71.0 in | Wt 220.2 lb

## 2023-08-07 DIAGNOSIS — I5032 Chronic diastolic (congestive) heart failure: Secondary | ICD-10-CM

## 2023-08-07 DIAGNOSIS — Z23 Encounter for immunization: Secondary | ICD-10-CM

## 2023-08-07 DIAGNOSIS — Z800 Family history of malignant neoplasm of digestive organs: Secondary | ICD-10-CM | POA: Diagnosis not present

## 2023-08-07 DIAGNOSIS — I10 Essential (primary) hypertension: Secondary | ICD-10-CM | POA: Diagnosis not present

## 2023-08-07 DIAGNOSIS — Z9889 Other specified postprocedural states: Secondary | ICD-10-CM

## 2023-08-07 DIAGNOSIS — H532 Diplopia: Secondary | ICD-10-CM | POA: Diagnosis not present

## 2023-08-07 DIAGNOSIS — Z0001 Encounter for general adult medical examination with abnormal findings: Secondary | ICD-10-CM | POA: Diagnosis not present

## 2023-08-07 LAB — COMPREHENSIVE METABOLIC PANEL
ALT: 28 U/L (ref 0–53)
AST: 24 U/L (ref 0–37)
Albumin: 4.3 g/dL (ref 3.5–5.2)
Alkaline Phosphatase: 29 U/L — ABNORMAL LOW (ref 39–117)
BUN: 10 mg/dL (ref 6–23)
CO2: 28 meq/L (ref 19–32)
Calcium: 9.4 mg/dL (ref 8.4–10.5)
Chloride: 100 meq/L (ref 96–112)
Creatinine, Ser: 1.09 mg/dL (ref 0.40–1.50)
GFR: 79.19 mL/min (ref >60.00)
Glucose, Bld: 96 mg/dL (ref 70–99)
Potassium: 4.2 meq/L (ref 3.5–5.1)
Sodium: 138 meq/L (ref 135–145)
Total Bilirubin: 0.6 mg/dL (ref 0.2–1.2)
Total Protein: 7.3 g/dL (ref 6.0–8.3)

## 2023-08-07 LAB — CBC WITH DIFFERENTIAL/PLATELET
Basophils Absolute: 0.1 10*3/uL (ref 0.0–0.1)
Basophils Relative: 1 % (ref 0.0–3.0)
Eosinophils Absolute: 0.3 10*3/uL (ref 0.0–0.7)
Eosinophils Relative: 4.6 % (ref 0.0–5.0)
HCT: 51.9 % (ref 39.0–52.0)
Hemoglobin: 16.7 g/dL (ref 13.0–17.0)
Lymphocytes Relative: 20.3 % (ref 12.0–46.0)
Lymphs Abs: 1.3 10*3/uL (ref 0.7–4.0)
MCHC: 32.2 g/dL (ref 30.0–36.0)
MCV: 87 fL (ref 78.0–100.0)
Monocytes Absolute: 0.7 10*3/uL (ref 0.1–1.0)
Monocytes Relative: 11 % (ref 3.0–12.0)
Neutro Abs: 3.9 10*3/uL (ref 1.4–7.7)
Neutrophils Relative %: 63.1 % (ref 43.0–77.0)
Platelets: 277 10*3/uL (ref 150.0–400.0)
RBC: 5.97 Mil/uL — ABNORMAL HIGH (ref 4.22–5.81)
RDW: 14.3 % (ref 11.5–15.5)
WBC: 6.3 10*3/uL (ref 4.0–10.5)

## 2023-08-07 LAB — LIPID PANEL
Cholesterol: 146 mg/dL (ref 0–200)
HDL: 40.8 mg/dL (ref >39.00)
LDL Cholesterol: 73 mg/dL (ref 0–99)
NonHDL: 105.07
Total CHOL/HDL Ratio: 4
Triglycerides: 159 mg/dL — ABNORMAL HIGH (ref 0.0–149.0)
VLDL: 31.8 mg/dL (ref 0.0–40.0)

## 2023-08-07 LAB — TSH: TSH: 1.71 u[IU]/mL (ref 0.35–5.50)

## 2023-08-07 NOTE — Patient Instructions (Addendum)
Please return in 12 months for your annual complete physical; please come fasting.   I will release your lab results to you on your MyChart account with further instructions. You may see the results before I do, but when I review them I will send you a message with my report or have my assistant call you if things need to be discussed. Please reply to my message with any questions. Thank you!   If you have any questions or concerns, please don't hesitate to send me a message via MyChart or call the office at [phone]. Thank you for visiting with [place] today! It's our pleasure caring for you.   VISIT SUMMARY:  During today's visit, we discussed your recurring double vision and dizziness, decreased exercise tolerance, well-managed hypertension, and a new skin lesion identified as seborrheic dermatosis. We also reviewed your general health maintenance needs, including vaccinations and upcoming screenings.  YOUR PLAN:  -DOUBLE VISION AND DIZZINESS: Double vision and dizziness can be caused by various factors, including migraines or blood pressure issues. Although your MRI was normal, it is recommended to follow up with a neurologist for further evaluation and potential migraine treatment. You mentioned wanting to delay this until your insurance changes due to cost concerns.  -DECREASED EXERCISE TOLERANCE: Decreased exercise tolerance means you are experiencing more difficulty with physical activity than before. This could be related to your recent surgery. We will evaluate the need for an echocardiogram and continue your current diuretic medication to manage leg swelling.  -HYPERTENSION: Hypertension, or high blood pressure, is being well-managed with your current medication. You have a cardiologist appointment scheduled for next week to ensure everything remains under control.  -SEBORRHEIC DERMATOSIS: Seborrheic dermatosis is a common, benign skin condition that causes flaky lesions. You should  monitor the lesion on the back of your knee for any changes in color, size, or appearance. If it becomes bothersome, cryotherapy can be considered.  -GENERAL HEALTH MAINTENANCE: You are due for a shingles vaccine and a flu shot. The shingles vaccine is a two-shot series, with the second dose due in six months. Your colonoscopy is up to date, with the next one due in a year.  INSTRUCTIONS:  - Follow up with a neurologist for double vision and dizziness. - Follow up with your cardiologist next week. - Return for basic lab work.

## 2023-08-07 NOTE — Progress Notes (Signed)
Subjective  Chief Complaint  Patient presents with   Annual Exam    Pt here for Annual Exam and is not currently fasting      Hypertension    HPI: Frederick Martin is a 52 y.o. male who presents to [facility] [place] today for a Male Wellness Visit. He also has the concerns and/or needs as listed above in the chief complaint. These will be addressed in addition to the Health Maintenance Visit.   Wellness Visit: annual visit with health maintenance review and exam   HM: colonoscopy surveillance due next year (q 5 year surveillance for + family history).  Continues to walk for exercise however has noticed increased shortness of breath and more difficulty maintaining a fast speed.  No chest pain or shortness of breath.  He has follow-up with cardiology next week.  Body mass index is 30.71 kg/m. Wt Readings from Last 3 Encounters:  08/07/23 220 lb 3.2 oz (99.9 kg)  03/06/23 226 lb (102.5 kg)  02/27/23 218 lb 6.4 oz (99.1 kg)     Chronic disease management visit and/or acute problem visit: Discussed the use of AI scribe software for clinical note transcription with the patient, who gave verbal consent to proceed.  History of Present Illness   The patient, aged 76, presents with a recurring issue of double vision occurring almost daily. Evaluated by neurology earlier this year.  Workup included blood work to rule out organic causes like vasculitis, myasthenia gravis.  Lab work was unremarkable.  Brain MRI showed chronic sinusitis and 2 small lesions likely related to microvascular disease but did not explain his symptoms.  Continues to have daily symptoms.  Each episode lasts for approximately one to two minutes. Accompanying the double vision, the patient experiences a sensation of feeling 'swimmy' in the head and a sense of instability. The patient has had an MRI, which did not reveal any significant findings. The patient also reports a change in vision after getting a  new prescription for glasses a few months ago, but it is unclear if this is related to the double vision episodes.  In addition, the patient has noticed a new skin lesion on the back of the knee, which was identified as a seborrheic dermatosis.  No itching, bleeding or change.  Follow-up diastolic CHF and history of mitral valve repair: The patient also reports a decrease in cardiovascular fitness post-surgery, experiencing increased shortness of breath during walks. Previously, the patient could walk two to three miles at a fast pace without difficulty, but now reports a slower pace of seventeen to eighteen minutes per mile.  The patient has a history of leg swelling, which is currently managed with a daily dose of diuretic medication. The patient has recently increased the dose to maintain control of the swelling.  Was using Lasix 3 times weekly, now has increased the dose to daily and doubled the dose.  No longer with swelling.  The patient is considering a follow-up with a neurologist regarding the double vision, but is concerned about the cost of the consultation. The patient is also due for a shingles vaccine and a flu shot, and a colonoscopy is due next year.       Assessment  1. Encounter for well adult exam with abnormal findings   2. Need for shingles vaccine   3. Need for influenza vaccination   4. Family history of colon cancer in father   5. Essential hypertension   6. CHF (congestive  heart failure), NYHA class II, chronic, diastolic (HCC)   7. S/P MVR (mitral valve repair)      Plan  Male Wellness Visit: Age appropriate Health Maintenance and Prevention measures were discussed with patient. Included topics are cancer screening recommendations, ways to keep healthy (see AVS) including dietary and exercise recommendations, regular eye and dental care, use of seat belts, and avoidance of moderate alcohol use and tobacco use. Screens current BMI: discussed patient's BMI and  encouraged positive lifestyle modifications to help get to or maintain a target BMI. HM needs and immunizations were addressed and ordered. See below for orders. See HM and immunization section for updates. 1st of 2 shingrix given/education. Flu shot updated today Routine labs and screening tests ordered including cmp, cbc and lipids where appropriate. Discussed recommendations regarding Vit D and calcium supplementation (see AVS)  Chronic disease f/u and/or acute problem visit: (deemed necessary to be done in addition to the wellness visit): Assessment and Plan    Double Vision and Dizziness Intermittent episodes of double vision and dizziness occurring almost daily, lasting 1-2 minutes. MRI results unremarkable. Differential diagnosis includes migraines and potential blood pressure-related issues. New glasses prescription may also be a contributing factor. Patient prefers to delay neurologist follow-up until insurance changes due to cost concerns. - Recommend neurologist follow-up for further evaluation and potential migraine treatment.  Decreased Exercise Tolerance/diastolic heart failure  Reports decreased exercise tolerance and increased dyspnea post-surgery. Previously able to walk 2-3 miles at a 16-minute mile pace, now reduced to 17-18 minute mile pace. Swelling in legs managed with increased diuretic dosage. - Evaluate need for echocardiogram. - Continue current diuretic regimen. has needed increase in lasix dose; now using daily.   Hypertension Blood pressure well-managed. Does not regularly check blood pressure at home. No recent high readings or issues reported. Cardiologist appointment scheduled for next week. - Continue current antihypertensive regimen. - Follow up with cardiologist next week.  Seborrheic Dermatosis Newly noticed lesion on the back of the knee, identified as seborrheic dermatosis. Lesion is benign, flaky, and can grow and flake off. Advised to monitor for changes  in color, size, or appearance. - Monitor lesion for changes in color, size, or appearance. - Consider cryotherapy if lesion becomes bothersome.  General Health Maintenance Due for shingles vaccine and flu shot. Colonoscopy up to date and next due in one year. Informed about the shingles vaccine being a two-shot series with the second dose in six months. - Administer shingles vaccine (first dose of two-shot series). - Administer flu shot. - Schedule colonoscopy for next year.  Follow-up - Follow up with neurologist for double vision and dizziness. - Follow up with cardiologist next week. - Return for basic lab work.        Follow up: 12 months for complete physical Commons side effects, risks, benefits, and alternatives for medications and treatment plan prescribed today were discussed, and the patient expressed understanding of the given instructions. Patient is instructed to call or message via MyChart if he/she has any questions or concerns regarding our treatment plan. No barriers to understanding were identified. We discussed Red Flag symptoms and signs in detail. Patient expressed understanding regarding what to do in case of urgent or emergency type symptoms.  Medication list was reconciled, printed and provided to the patient in AVS. Patient instructions and summary information was reviewed with the patient as documented in the AVS. This note was prepared with assistance of Dragon voice recognition software. Occasional wrong-word or sound-a-like substitutions may have  occurred due to the inherent limitations of voice recognition software  Orders Placed This Encounter  Procedures   Zoster Recombinant (Shingrix )   Flu vaccine trivalent PF, 6mos and older(Flulaval,Afluria,Fluarix,Fluzone)   No orders of the defined types were placed in this encounter.    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   CHF (congestive  heart failure), NYHA class II, chronic, diastolic (HCC) 01/26/2022   S/P MVR (mitral valve repair) 11/11/2021   Essential hypertension 09/27/2020   Family history of colon cancer in father 04/05/2018   Male hypogonadism 08/06/2019   Gastroesophageal reflux disease 03/07/2017   Mild intermittent asthma without complication 03/07/2017   Erectile dysfunction 06/28/2016   MVP (mitral valve prolapse) 03/21/2016   Allergic rhinitis 07/24/2011   Health Maintenance  Topic Date Due   Zoster Vaccines- Shingrix (1 of 2) Never done   INFLUENZA VACCINE  05/03/2023   COVID-19 Vaccine (4 - 2023-24 season) 08/23/2023 (Originally 06/03/2023)   Colonoscopy  06/29/2024   DTaP/Tdap/Td (3 - Td or Tdap) 01/28/2032   Hepatitis C Screening  Completed   HIV Screening  Completed   HPV VACCINES  Aged Out   Immunization History  Administered Date(s) Administered   Influenza Split 07/11/2013   Influenza, Quadrivalent, Recombinant, Inj, Pf 08/13/2016   Influenza,inj,Quad PF,6+ Mos 11/09/2018, 06/28/2020, 08/03/2022   Influenza,inj,quad, With Preservative 07/28/2017   Influenza-Unspecified 09/14/2021   Moderna SARS-COV2 Booster Vaccination 10/15/2020   PFIZER(Purple Top)SARS-COV-2 Vaccination 12/07/2019, 01/07/2020   Pneumococcal Polysaccharide-23 04/05/2018   Tdap 04/02/2011, 01/27/2022   We updated and reviewed the patient's past history in detail and it is documented below. Allergies: Patient is allergic to losartan. Past Medical History  has a past medical history of Asthma, GERD (gastroesophageal reflux disease), Heart murmur, Hypertension, and MVP (mitral valve prolapse). Past Surgical History Patient  has a past surgical history that includes Wisdom tooth extraction (2001); RIGHT/LEFT HEART CATH AND CORONARY ANGIOGRAPHY (N/A, 09/08/2021); and TEE without cardioversion (N/A, 09/08/2021). Social History Patient  reports that he has never smoked. He has never used smokeless tobacco. He reports current  alcohol use. He reports that he does not use drugs. Family History family history includes Cancer in his maternal grandmother; Colon cancer in his father and paternal grandfather; Depression in his father and mother; Diabetes in his father; Esophageal cancer in his maternal grandfather; Heart disease in his father; Hyperlipidemia in his father; Hypertension in his father. Review of Systems: Constitutional: negative for fever or malaise Ophthalmic: negative for photophobia, double vision or loss of vision Cardiovascular: negative for chest pain, dyspnea on exertion, or new LE swelling Respiratory: negative for SOB or persistent cough Gastrointestinal: negative for abdominal pain, change in bowel habits or melena Genitourinary: negative for dysuria or gross hematuria Musculoskeletal: negative for new gait disturbance or muscular weakness Integumentary: negative for new or persistent rashes Neurological: negative for TIA or stroke symptoms Psychiatric: negative for SI or delusions Allergic/Immunologic: negative for hives  Patient Care Team    Relationship Specialty Notifications Start End  Willow Ora, MD PCP - General Family Medicine  05/18/20   Thurmon Fair, MD PCP - Cardiology Cardiology  08/24/21   Tressia Danas, MD (Inactive) Consulting Physician Gastroenterology  05/18/20   Orlinda Blalock, MD Referring Physician Urology  05/18/20   Asa Lente, MD Consulting Physician Neurology  03/07/23    Objective  Vitals: BP 128/88   Pulse 75   Temp 97.9 F (36.6 C)  Ht 5\' 11"  (1.803 m)   Wt 220 lb 3.2 oz (99.9 kg)   SpO2 94%   BMI 30.71 kg/m  General:  Well developed, well nourished, no acute distress  Psych:  Alert and orientedx3,normal mood and affect HEENT:  Normocephalic, atraumatic, non-icteric sclera,  oropharynx is clear without mass or exudate, supple neck without adenopathy, or thyromegaly Cardiovascular:  Normal S1, S2, RRR without gallop, rub or murmur,   Respiratory:  Good breath sounds bilaterally, CTAB with normal respiratory effort Gastrointestinal: normal bowel sounds, soft, non-tender, no noted masses. No HSM MSK: Joints are without erythema or swelling.  Skin:  Warm, no rashes, small beige raised mole on back of left knee, some flaking Neurologic:    Mental status is normal.  Gross motor and sensory exams are normal. Stable gait. No tremor

## 2023-08-09 NOTE — Progress Notes (Signed)
See mychart note Dear Mr. Buxton, Your lab results are all stable.  Sincerely, Dr. Mardelle Matte

## 2023-08-20 ENCOUNTER — Ambulatory Visit: Payer: 59 | Attending: Cardiovascular Disease | Admitting: Cardiovascular Disease

## 2023-08-20 ENCOUNTER — Encounter: Payer: Self-pay | Admitting: Cardiovascular Disease

## 2023-08-20 VITALS — BP 110/76 | HR 64 | Ht 71.0 in | Wt 219.6 lb

## 2023-08-20 DIAGNOSIS — E669 Obesity, unspecified: Secondary | ICD-10-CM | POA: Diagnosis not present

## 2023-08-20 DIAGNOSIS — R0609 Other forms of dyspnea: Secondary | ICD-10-CM | POA: Diagnosis not present

## 2023-08-20 DIAGNOSIS — I5032 Chronic diastolic (congestive) heart failure: Secondary | ICD-10-CM | POA: Diagnosis not present

## 2023-08-20 DIAGNOSIS — I341 Nonrheumatic mitral (valve) prolapse: Secondary | ICD-10-CM

## 2023-08-20 MED ORDER — EMPAGLIFLOZIN 10 MG PO TABS: 10.0000 mg | ORAL_TABLET | Freq: Every day | ORAL | 3 refills | Status: DC

## 2023-08-20 NOTE — Patient Instructions (Addendum)
Medication Instructions:  Jardiance 10 mg daily  Take Furosemide 20 mg daily when you start Jardiance *If you need a refill on your cardiac medications before your next appointment, please call your pharmacy*   Testing/Procedures: Your physician has requested that you have an echocardiogram In 3 Months. Echocardiography is a painless test that uses sound waves to create images of your heart. It provides your doctor with information about the size and shape of your heart and how well your heart's chambers and valves are working. This procedure takes approximately one hour. There are no restrictions for this procedure. Please do NOT wear cologne, perfume, aftershave, or lotions (deodorant is allowed). Please arrive 15 minutes prior to your appointment time.  Please note: We ask at that you not bring children with you during ultrasound (echo/ vascular) testing. Due to room size and safety concerns, children are not allowed in the ultrasound rooms during exams. Our front office staff cannot provide observation of children in our lobby area while testing is being conducted. An adult accompanying a patient to their appointment will only be allowed in the ultrasound room at the discretion of the ultrasound technician under special circumstances. We apologize for any inconvenience.    Follow-Up: At [facility], you and your health needs are our priority.  As part of our continuing mission to provide you with exceptional heart care, we have created designated Provider Care Teams.  These Care Teams include your primary Cardiologist (physician) and Advanced Practice Providers (APPs -  Physician Assistants and Nurse Practitioners) who all work together to provide you with the care you need, when you need it.  We recommend signing up for the patient portal called "MyChart".  Sign up information is provided on this After Visit Summary.  MyChart is used to connect with patients for Virtual Visits  (Telemedicine).  Patients are able to view lab/test results, encounter notes, upcoming appointments, etc.  Non-urgent messages can be sent to your provider as well.   To learn more about what you can do with MyChart, go to [email or web address].    Your next appointment:   3-4 months with APP  1 yr with Dr Royann Shivers

## 2023-08-20 NOTE — Progress Notes (Signed)
Cardiology Office Note:    Date:  08/20/2023   ID:  Frederick Martin, DOB 11-15-1970, MRN [id number]  PCP:  Frederick Ora, MD   [facility] Providers Cardiologist:  Frederick Fair, MD     Referring MD: Frederick Ora, MD   Chief Complaint  Patient presents with   Shortness of Breath     History of Present Illness:    Frederick Martin is a 52 y.o. male with a hx of mitral valve prolapse with symptomatic severe mitral insufficiency.  He underwent mitral valve repair with Dr. Zebedee Martin (11/11/2021, 40 mm stimulus semirigid band), with an excellent surgical result (mean gradient 3 mmHg, no residual MR), but with mildly decreased LVEF around 50%.  He developed urticaria with angiotensin receptor blockers.  Generally feels well but has had less improvement in his shortness of breath and he had hoped for after surgery.  After about 2 miles of walking he feels that he is "sucking wind".  Dyspnea is the dominant symptom, rather than fatigue or muscle weakness.  Remains borderline obese with a BMI just over 30.  Does not have orthopnea, PND, but does have lower extremity edema especially towards the end of the day (she works at a Engineer, agricultural).  Has not had any chest pain, palpitations, dizziness or syncope.  He has been taking furosemide 40 mg on a daily basis.  After initiating treatment with losartan he developed urticaria on both lower extremities.  This resolved after stopping the medication.      The echo performed at [facility] on 11/15/2021 showed mild left ventricular systolic dysfunction EF 50%, with mild LVH, trivial residual MR, mitral valve peak gradient 6 mmHg, mean gradient 3 mmHg, estimated systolic PA pressure 30 mmHg.  No mention of pericardial effusion.  Follow-up echocardiogram 12/15/2021 showed LVEF 50-55% and mean mitral valve Brein 3 mmHg at 110 bpm, trivial residual mitral regurgitation.  Frederick Martin has a history of GERD and a remote esophagoplasty, but he does not currently have any  difficulty with swallowing food or medications.  Home scale usually shows about 7 pounds lower than our office scale.  Past Medical History:  Diagnosis Date   Asthma    GERD (gastroesophageal reflux disease)    Heart murmur    Hypertension    MVP (mitral valve prolapse)     Past Surgical History:  Procedure Laterality Date   RIGHT/LEFT HEART CATH AND CORONARY ANGIOGRAPHY N/A 09/08/2021   Procedure: RIGHT/LEFT HEART CATH AND CORONARY ANGIOGRAPHY;  Surgeon: Kathleene Hazel, MD;  Location: [facility] INVASIVE CV LAB;  Service: Cardiovascular;  Laterality: N/A;   TEE WITHOUT CARDIOVERSION N/A 09/08/2021   Procedure: TRANSESOPHAGEAL ECHOCARDIOGRAM (TEE);  Surgeon: Wendall Stade, MD;  Location: [facility] ENDOSCOPY;  Service: Cardiovascular;  Laterality: N/A;   WISDOM TOOTH EXTRACTION  2001    Current Medications: Current Meds  Medication Sig   bisoprolol (ZEBETA) 5 MG tablet TAKE ONE-HALF TABLET BY MOUTH  DAILY   empagliflozin (JARDIANCE) 10 MG TABS tablet Take 1 tablet (10 mg total) by mouth daily before breakfast.   esomeprazole (NEXIUM) 20 MG capsule Take 20 mg by mouth daily at 6 (six) AM.   furosemide (LASIX) 20 MG tablet TAKE 1 TABLET BY MOUTH DAILY AS  NEEDED FOR A WEIGHT OVER 200 LBS   Multiple Vitamins-Minerals (MULTIVITAMIN WITH MINERALS) tablet Take 1 tablet by mouth daily.   sildenafil (REVATIO) 20 MG tablet Take 1 to 5 tablets as needed   testosterone cypionate (DEPOTESTOSTERONE CYPIONATE) 200 MG/ML injection Inject  into the muscle every 14 (fourteen) days.   triamcinolone cream (KENALOG) 0.1 % Apply 1 Application topically 2 (two) times daily. For 2 weeks, then as needed     Allergies:   Losartan   Social History   Socioeconomic History   Marital status: Divorced    Spouse name: Not on file   Number of children: 3   Years of education: Not on file   Highest education level: Associate degree: occupational, Scientist, product/process development, or vocational program  Occupational History    Occupation: Associate Professor  Tobacco Use   Smoking status: Never   Smokeless tobacco: Never  Vaping Use   Vaping status: Never Used  Substance and Sexual Activity   Alcohol use: Yes   Drug use: Never   Sexual activity: Yes  Other Topics Concern   Not on file  Social History Narrative   Right Handed   2-3 Cups of Coffee per Day   Soda when he goes out to eat   Social Determinants of Health   Financial Resource Strain: Medium Risk (02/26/2023)   Overall Financial Resource Strain (CARDIA)    Difficulty of Paying Living Expenses: Somewhat hard  Food Insecurity: No Food Insecurity (02/26/2023)   Hunger Vital Sign    Worried About Running Out of Food in the Last Year: Never true    Ran Out of Food in the Last Year: Never true  Transportation Needs: No Transportation Needs (02/26/2023)   PRAPARE - Administrator, Civil Service (Medical): No    Lack of Transportation (Non-Medical): No  Physical Activity: Insufficiently Active (02/26/2023)   Exercise Vital Sign    Days of Exercise per Week: 2 days    Minutes of Exercise per Session: 30 min  Stress: No Stress Concern Present (02/26/2023)   [employer] of Occupational Health - Occupational Stress Questionnaire    Feeling of Stress : Only a little  Social Connections: Socially Isolated (02/26/2023)   Social Connection and Isolation Panel [NHANES]    Frequency of Communication with Friends and Family: Once a week    Frequency of Social Gatherings with Friends and Family: Twice a week    Attends Religious Services: Never    Database administrator or Organizations: No    Attends Engineer, structural: Not on file    Marital Status: Divorced     Family History: The patient's family history includes Cancer in his maternal grandmother; Colon cancer in his father and paternal grandfather; Depression in his father and mother; Diabetes in his father; Esophageal cancer in his maternal grandfather; Heart disease  in his father; Hyperlipidemia in his father; Hypertension in his father. There is no history of Stomach cancer or Rectal cancer.  ROS:   Please see the history of present illness.     All other systems reviewed and are negative.  EKGs/Labs/Other Studies Reviewed:    The following studies were reviewed today: Echocardiogram 12/15/2021  1. Limited echo to evaluate mitral valve repair   2. Left ventricular ejection fraction, by estimation, is 50 to 55%. The  left ventricle has low normal function. There is mild left ventricular  hypertrophy.   3. The mitral valve has been repaired/replaced. Trivial mitral valve  regurgitation. No evidence of mitral stenosis. The mean mitral valve  gradient is 3.0 mmHg. Peak gradient 5.4 mmHg. There is a prosthetic  annuloplasty ring present in the mitral  position. Procedure Date: 11/11/2021.   4. The aortic valve is tricuspid. Aortic valve regurgitation is  not  visualized. No aortic stenosis is present.   EKG:    EKG Interpretation Date/Time:  Monday August 20 2023 08:19:57 EST Ventricular Rate:  64 PR Interval:  148 QRS Duration:  86 QT Interval:  402 QTC Calculation: 414 R Axis:   12  Text Interpretation: Normal sinus rhythm Normal ECG When compared with ECG of 15-Aug-2021 17:34, T wave inversion no longer evident in Inferior leads Confirmed by Naomi Fitton [phone]) on 08/20/2023 8:26:46 AM         Recent Labs: 08/07/2023: ALT 28; BUN 10; Creatinine, Ser 1.09; Hemoglobin 16.7; Platelets 277.0; Potassium 4.2; Sodium 138; TSH 1.71  Recent Lipid Panel    Component Value Date/Time   CHOL 146 08/07/2023 0900   TRIG 159.0 (H) 08/07/2023 0900   HDL 40.80 08/07/2023 0900   CHOLHDL 4 08/07/2023 0900   VLDL 31.8 08/07/2023 0900   LDLCALC 73 08/07/2023 0900   LDLCALC 76 05/18/2020 1034     Risk Assessment/Calculations:           Physical Exam:    VS:  BP 110/76 (BP Location: Left Arm, Patient Position: Sitting, Cuff Size: Normal)    Pulse 64   Ht 5\' 11"  (1.803 m)   Wt 219 lb 9.6 oz (99.6 kg)   SpO2 95%   BMI 30.63 kg/m     Wt Readings from Last 3 Encounters:  08/20/23 219 lb 9.6 oz (99.6 kg)  08/07/23 220 lb 3.2 oz (99.9 kg)  03/06/23 226 lb (102.5 kg)    General: Alert, oriented x3, no distress, borderline obese Head: no evidence of trauma, PERRL, EOMI, no exophtalmos or lid lag, no myxedema, no xanthelasma; normal ears, nose and oropharynx Neck: normal jugular venous pulsations and no hepatojugular reflux; brisk carotid pulses without delay and no carotid bruits Chest: clear to auscultation, no signs of consolidation by percussion or palpation, normal fremitus, symmetrical and full respiratory excursions Cardiovascular: normal position and quality of the apical impulse, regular rhythm, normal first and second heart sounds, no murmurs, rubs or gallops Abdomen: no tenderness or distention, no masses by palpation, no abnormal pulsatility or arterial bruits, normal bowel sounds, no hepatosplenomegaly Extremities: no clubbing, cyanosis or edema; 2+ radial, ulnar and brachial pulses bilaterally; 2+ right femoral, posterior tibial and dorsalis pedis pulses; 2+ left femoral, posterior tibial and dorsalis pedis pulses; no subclavian or femoral bruits Neurological: grossly nonfocal Psych: Normal mood and affect    ASSESSMENT:    1. CHF (congestive heart failure), NYHA class II, chronic, diastolic (HCC)   2. MVP (mitral valve prolapse)   3. Dyspnea on exertion   4. Mild obesity      PLAN:    In order of problems listed above:  S/P MVRepair: Excellent technical result.  Mildly decreased LVEF postop consistent with longstanding severe MR.  Reminded him of the need for endocarditis prevention with dental procedures (amoxicillin 2 g 1 hour before dental procedures that involve bleeding such as root canals, tooth extractions, apical resection, etc.).Marland Kitchen   CHF with mildly reduced EF: He does have some symptoms of  exertional dyspnea.  Has been taking diuretics on a daily basis.  NYHA class I-II. Postop EF 50% (49% x 3 D echo), consistent with what was probably longstanding severe MR before repair.  Continue sodium restricted diet, regular exercise, weight monitoring.we discussed signs and symptoms of heart failure exacerbation.  He is taking a beta-blocker.  Had urticaria with ARB.  Will add Jardiance 10 mg daily.  Warned him of the  risk of genital/groin yeast infections and how to address them.  Also discussed the low risk for the very dangerous potential complication of Fournier's gangrene and when he should contact us. Obesity: SGLT2 inhibitor may also help with weight loss.   Medication Adjustments/Labs and Tests Ordered: Current medicines are reviewed at length with the patient today.  Concerns regarding medicines are outlined above.  Orders Placed This Encounter  Procedures   EKG 12-Lead   ECHOCARDIOGRAM COMPLETE   Meds ordered this encounter  Medications   empagliflozin (JARDIANCE) 10 MG TABS tablet    Sig: Take 1 tablet (10 mg total) by mouth daily before breakfast.    Dispense:  90 tablet    Refill:  3     Patient Instructions  Medication Instructions:  Jardiance 10 mg daily  Take Furosemide 20 mg daily when you start Jardiance *If you need a refill on your cardiac medications before your next appointment, please call your pharmacy*   Testing/Procedures: Your physician has requested that you have an echocardiogram In 3 Months. Echocardiography is a painless test that uses sound waves to create images of your heart. It provides your doctor with information about the size and shape of your heart and how well your heart's chambers and valves are working. This procedure takes approximately one hour. There are no restrictions for this procedure. Please do NOT wear cologne, perfume, aftershave, or lotions (deodorant is allowed). Please arrive 15 minutes prior to your appointment  time.  Please note: We ask at that you not bring children with you during ultrasound (echo/ vascular) testing. Due to room size and safety concerns, children are not allowed in the ultrasound rooms during exams. Our front office staff cannot provide observation of children in our lobby area while testing is being conducted. An adult accompanying a patient to their appointment will only be allowed in the ultrasound room at the discretion of the ultrasound technician under special circumstances. We apologize for any inconvenience.    Follow-Up: At [facility], you and your health needs are our priority.  As part of our continuing mission to provide you with exceptional heart care, we have created designated Provider Care Teams.  These Care Teams include your primary Cardiologist (physician) and Advanced Practice Providers (APPs -  Physician Assistants and Nurse Practitioners) who all work together to provide you with the care you need, when you need it.  We recommend signing up for the patient portal called "MyChart".  Sign up information is provided on this After Visit Summary.  MyChart is used to connect with patients for Virtual Visits (Telemedicine).  Patients are able to view lab/test results, encounter notes, upcoming appointments, etc.  Non-urgent messages can be sent to your provider as well.   To learn more about what you can do with MyChart, go to [email or web address].    Your next appointment:   3-4 months with APP  1 yr with Dr Royann Shivers   Signed, Frederick Fair, MD  08/20/2023 8:47 AM    [facility]

## 2023-09-04 ENCOUNTER — Telehealth: Payer: Self-pay | Admitting: Family Medicine

## 2023-09-04 MED ORDER — ALBUTEROL SULFATE HFA 108 (90 BASE) MCG/ACT IN AERS: 1.0000 | INHALATION_SPRAY | RESPIRATORY_TRACT | 5 refills | Status: AC | PRN

## 2023-09-04 NOTE — Telephone Encounter (Signed)
Prescription Request  09/04/2023  LOV: 08/07/2023  What is the name of the medication or equipment? albuterol (VENTOLIN HFA) 108 (90 Base) MCG/ACT inhaler   Have you contacted your pharmacy to request a refill? Yes   Which pharmacy would you like this sent to?   [facility] [address] [place], [place] - [address], [place], [place] [address] [address]  [place], [place] [address] Phone: [phone] Fax: [phone]    Patient notified that their request is being sent to the clinical staff for review and that they should receive a response within 2 business days.   Please advise at Mobile [phone] (mobile)

## 2023-10-11 ENCOUNTER — Other Ambulatory Visit (HOSPITAL_BASED_OUTPATIENT_CLINIC_OR_DEPARTMENT_OTHER): Payer: Self-pay

## 2023-11-07 ENCOUNTER — Ambulatory Visit (HOSPITAL_COMMUNITY): Payer: 59 | Attending: Cardiovascular Disease

## 2023-11-07 ENCOUNTER — Encounter: Payer: Self-pay | Admitting: Cardiovascular Disease

## 2023-11-07 DIAGNOSIS — R0609 Other forms of dyspnea: Secondary | ICD-10-CM | POA: Diagnosis present

## 2023-11-07 LAB — ECHOCARDIOGRAM COMPLETE
Area-P 1/2: 4.58 cm2
Est EF: 50
MV M vel: 4.67 m/s
MV Peak grad: 87.2 mm[Hg]
MV VTI: 1.39 cm2
S' Lateral: 3.8 cm

## 2023-11-13 NOTE — Progress Notes (Unsigned)
 Cardiology Office Note   Date:  11/14/2023  ID:  Frederick Martin, DOB 1971-04-28, MRN [id number] PCP:  Willow Ora, MD [facility] Cardiologist: Thurmon Fair, MD  Reason for visit: Follow-up  History of Present Illness    Frederick Martin is a 53 y.o. male with a hx of mitral valve prolapse with symptomatic severe mitral insufficiency. He underwent mitral valve repair with Dr. Zebedee Iba (11/11/2021, 40 mm stimulus semirigid band), with an excellent surgical result (mean gradient 3 mmHg, no residual MR), but with mildly decreased LVEF around 50%. He developed urticaria with angiotensin receptor blockers.    Patient continued to have dyspnea on exertion.  Patient was last seen by Dr. Royann Shivers November 2024.  Jardiance 10 mg daily was added for his CHF with mildly reduced EF and obesity.  Recommended to take Lasix 20 mg daily after starting Jardiance.  Today, patient feels well.  He wonders if some of the dyspnea he had in November was secondary to allergies.  He uses a standing desk at work.  He tries to get a couple miles of walking in per day.  He also climbs 3 flights of stairs when he goes to work.  Patient feels dyspnea with significant exertion, no dyspnea with regular activity.  Patient thinks his leg swelling is slightly improved after starting Jardiance.  Compression socks help his edema.  He is able to get his shoes on without difficulty.  He denies PND, orthopnea and bloating.  His weight has been stable.  He is trying to eat healthy.  He denies chest pain.  He mentions dizzy spells daily -states his equilibrium is off.  He states he had a brain MRI that was unremarkable.   Objective / Physical Exam   Vital signs:  BP 102/74 (BP Location: Left Arm, Patient Position: Sitting)   Pulse 66   Wt 218 lb (98.9 kg)   SpO2 95%   BMI 30.40 kg/m     GEN: No acute distress NECK: No carotid bruits CARDIAC: RRR, no murmurs RESPIRATORY:  Clear to auscultation without rales, wheezing or  rhonchi  EXTREMITIES: No edema  Assessment and Plan   S/p MV repair -mitral valve prolapse with symptomatic severe mitral insufficiency. He underwent mitral valve repair with Dr. Zebedee Iba (11/11/2021, 40 mm stimulus semirigid band), with an excellent surgical result (mean gradient 3 mmHg, no residual MR), but with mildly decreased LVEF around 50%.  -Echo 11/2003 with EF 50%, grade 2 diastolic dysfunction, no significant mitral stenosis with mean gradient 3 mmHg, mild MR. -Continue antibiotic prophylaxis with amoxicillin prior to dental procedures -Monitor with annual echo.  CHF with mildly reduced EF, euvolemic today Dyspnea on exertion, improved -EF 50% 11/2023 (EF 65-70% prior to MV repair) -History of urticaria with angiotensin receptor blockers.   -Jardiance 10 mg daily added November 2024. -Continue Jardiance 10 mg daily and Lasix 20 mg daily.  We reviewed possible side effects. -Check BMET today. -Advised okay to take extra dose of Lasix 20 mg if he notices significant swelling or weight gain of 3 pounds in a day or 5 pounds in a week. -Continue to use compression stockings. -Given dizzy spells and borderline blood pressure today, we will stop his Bisoprolol 5 mg half tablet daily.  Start Toprol-XL 25 mg once daily.  Obesity -Even a 5-10% weight loss can have cardiovascular benefits.   -Recommend moderate intensity activity for 30 minutes 5 days/week and the DASH diet.  Given patient information sheet on healthy lifestyle.  Disposition -  Follow-up in 6 months.  Echo in 1 year.   Signed, Cannon Kettle, PA-C  11/14/2023 [facility]

## 2023-11-14 ENCOUNTER — Ambulatory Visit: Payer: 59 | Attending: Physician Assistant | Admitting: Physician Assistant

## 2023-11-14 VITALS — BP 102/74 | HR 66 | Wt 218.0 lb

## 2023-11-14 DIAGNOSIS — Z9889 Other specified postprocedural states: Secondary | ICD-10-CM

## 2023-11-14 DIAGNOSIS — R0609 Other forms of dyspnea: Secondary | ICD-10-CM | POA: Diagnosis not present

## 2023-11-14 DIAGNOSIS — I5032 Chronic diastolic (congestive) heart failure: Secondary | ICD-10-CM

## 2023-11-14 DIAGNOSIS — E669 Obesity, unspecified: Secondary | ICD-10-CM | POA: Diagnosis not present

## 2023-11-14 LAB — BASIC METABOLIC PANEL
BUN/Creatinine Ratio: 11 (ref 9–20)
BUN: 12 mg/dL (ref 6–24)
CO2: 24 mmol/L (ref 20–29)
Calcium: 9.5 mg/dL (ref 8.7–10.2)
Chloride: 101 mmol/L (ref 96–106)
Creatinine, Ser: 1.1 mg/dL (ref 0.76–1.27)
Glucose: 84 mg/dL (ref 70–99)
Potassium: 4.9 mmol/L (ref 3.5–5.2)
Sodium: 139 mmol/L (ref 134–144)
eGFR: 82 mL/min/{1.73_m2} (ref >59)

## 2023-11-14 MED ORDER — METOPROLOL SUCCINATE ER 25 MG PO TB24: 25.0000 mg | ORAL_TABLET | Freq: Every day | ORAL | 3 refills | Status: DC

## 2023-11-14 NOTE — Patient Instructions (Signed)
Medication Instructions:  Stop Bisoprolol. Start Metoprolol succinate 25 mg (Take 1 Tablet Daily). *If you need a refill on your cardiac medications before your next appointment, please call your pharmacy*   Lab Work: BMET : Today If you have labs (blood work) drawn today and your tests are completely normal, you will receive your results only by: MyChart Message (if you have MyChart) OR A paper copy in the mail If you have any lab test that is abnormal or we need to change your treatment, we will call you to review the results.   Testing/Procedures: No Testing   Follow-Up: At [facility], you and your health needs are our priority.  As part of our continuing mission to provide you with exceptional heart care, we have created designated Provider Care Teams.  These Care Teams include your primary Cardiologist (physician) and Advanced Practice Providers (APPs -  Physician Assistants and Nurse Practitioners) who all work together to provide you with the care you need, when you need it.  We recommend signing up for the patient portal called "MyChart".  Sign up information is provided on this After Visit Summary.  MyChart is used to connect with patients for Virtual Visits (Telemedicine).  Patients are able to view lab/test results, encounter notes, upcoming appointments, etc.  Non-urgent messages can be sent to your provider as well.   To learn more about what you can do with MyChart, go to [email or web address].    Your next appointment:   6 month(s)  Provider:   Thurmon Fair, MD

## 2023-11-22 ENCOUNTER — Telehealth: Payer: Self-pay

## 2023-11-22 NOTE — Telephone Encounter (Addendum)
 Results viewed by patient via Mychart.----- Message from Cannon Kettle sent at 11/19/2023  4:20 PM EST ----- Normal electrolytes and kidney function. Continue current medications.

## 2023-12-10 ENCOUNTER — Encounter: Payer: Self-pay | Admitting: Family Medicine

## 2023-12-10 ENCOUNTER — Ambulatory Visit (INDEPENDENT_AMBULATORY_CARE_PROVIDER_SITE_OTHER): Admitting: Family Medicine

## 2023-12-10 VITALS — BP 121/82 | HR 74 | Temp 97.7°F | Ht 71.0 in | Wt 213.2 lb

## 2023-12-10 DIAGNOSIS — Z23 Encounter for immunization: Secondary | ICD-10-CM | POA: Diagnosis not present

## 2023-12-10 DIAGNOSIS — S56911A Strain of unspecified muscles, fascia and tendons at forearm level, right arm, initial encounter: Secondary | ICD-10-CM | POA: Diagnosis not present

## 2023-12-10 MED ORDER — DICLOFENAC SODIUM 75 MG PO TBEC: 75.0000 mg | DELAYED_RELEASE_TABLET | Freq: Two times a day (BID) | ORAL | 0 refills | Status: AC

## 2023-12-10 NOTE — Progress Notes (Signed)
 Subjective  CC:  Chief Complaint  Patient presents with   Arm Pain    Pt stated that he was stacking logs about 3/4 months ago and it has not healed    HPI: Frederick Martin is a 53 y.o. male who presents to the office today to address the problems listed above in the chief complaint. Discussed the use of AI scribe software for clinical note transcription with the patient, who gave verbal consent to proceed.  History of Present Illness   He presents for a second shingles shot and evaluation of arm pain.  He has been experiencing arm pain for the past three to four months, which began after moving logs. The pain is significant enough to interfere with daily activities such as brushing his teeth and pouring water, necessitating the use of his other hand. The pain is primarily located in the forearm, described as tender but not acutely painful, and occasionally radiates to the bicep without affecting the shoulder. There was no specific traumatic injury at the onset, and he has not taken any medications or sought prior treatment. No tenderness upon palpation and pain with most movements, except for external rotation, which causes discomfort in the forearm area. He notes a high tolerance for pain, which may affect his perception of tenderness.  He is due for the second dose of the shingles vaccine. He tolerated the first dose with mild symptoms, which is typical.       Assessment  1. Forearm strain, right, initial encounter   2. Need for shingles vaccine   3. Need for pneumococcal 20-valent conjugate vaccination      Plan  Assessment and Plan    Forearm Strain Chronic pain and discomfort in the forearm following heavy lifting several months ago. No acute injury or trauma. Pain primarily localized to the forearm extensors. No significant tenderness on examination. Likely inflammation and muscle strain. -Start Diclofenac (Voltaren) 1 pill BID for 2 weeks. -Initiate gentle stretching  exercises for the forearm extensors. -See AVS for examples and demonstrated in office today -Consider physical therapy if no improvement in 2 weeks.  Vaccinations Patient due for second shingles shot and qualifies for early pneumococcal vaccine due to mild intermittent asthma. -Administer second shingles vaccine today. -Administer pneumococcal vaccine today.      Orders Placed This Encounter  Procedures   Zoster Recombinant (Shingrix )   Meds ordered this encounter  Medications   diclofenac (VOLTAREN) 75 MG EC tablet    Sig: Take 1 tablet (75 mg total) by mouth 2 (two) times daily.    Dispense:  30 tablet    Refill:  0     I reviewed the patients updated PMH, FH, and SocHx.    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    Priority: High   CHF (congestive heart failure), NYHA class II, chronic, diastolic (HCC) 01/26/2022    Priority: High   S/P MVR (mitral valve repair) 11/11/2021    Priority: High   Essential hypertension 09/27/2020    Priority: High   Family history of colon cancer in father 04/05/2018    Priority: High   Male hypogonadism 08/06/2019    Priority: Medium    Gastroesophageal reflux disease 03/07/2017    Priority: Medium    Mild intermittent asthma without complication 03/07/2017    Priority: Medium    Erectile dysfunction 06/28/2016    Priority: Medium    MVP (mitral valve prolapse)  03/21/2016    Priority: Medium    Allergic rhinitis 07/24/2011    Priority: Low   Current Meds  Medication Sig   albuterol (VENTOLIN HFA) 108 (90 Base) MCG/ACT inhaler Inhale 1-2 puffs into the lungs every 4 (four) hours as needed.   diclofenac (VOLTAREN) 75 MG EC tablet Take 1 tablet (75 mg total) by mouth 2 (two) times daily.   empagliflozin (JARDIANCE) 10 MG TABS tablet Take 1 tablet (10 mg total) by mouth daily before breakfast.   esomeprazole (NEXIUM) 20 MG capsule Take 20 mg by mouth daily at 6 (six)  AM.   furosemide (LASIX) 20 MG tablet TAKE 1 TABLET BY MOUTH DAILY AS  NEEDED FOR A WEIGHT OVER 200 LBS   metoprolol succinate (TOPROL XL) 25 MG 24 hr tablet Take 1 tablet (25 mg total) by mouth daily.   Multiple Vitamins-Minerals (MULTIVITAMIN WITH MINERALS) tablet Take 1 tablet by mouth daily.   sildenafil (REVATIO) 20 MG tablet Take 1 to 5 tablets as needed   testosterone cypionate (DEPOTESTOSTERONE CYPIONATE) 200 MG/ML injection Inject into the muscle every 14 (fourteen) days.   triamcinolone cream (KENALOG) 0.1 % Apply 1 Application topically 2 (two) times daily. For 2 weeks, then as needed    Allergies: Patient is allergic to losartan. Family History: Patient family history includes Cancer in his maternal grandmother; Colon cancer in his father and paternal grandfather; Depression in his father and mother; Diabetes in his father; Esophageal cancer in his maternal grandfather; Heart disease in his father; Hyperlipidemia in his father; Hypertension in his father. Social History:  Patient  reports that he has never smoked. He has never used smokeless tobacco. He reports current alcohol use. He reports that he does not use drugs.  Review of Systems: Constitutional: Negative for fever malaise or anorexia Cardiovascular: negative for chest pain Respiratory: negative for SOB or persistent cough Gastrointestinal: negative for abdominal pain  Objective  Vitals: BP 121/82   Pulse 74   Temp 97.7 F (36.5 C)   Ht 5\' 11"  (1.803 m)   Wt 213 lb 3.2 oz (96.7 kg)   SpO2 97%   BMI 29.74 kg/m  General: no acute distress , A&Ox3 Right forearm: no elbow ttp. No epicondyle ttp. FROM, slight pain with full extension. Mild ttp over extensor musles. No pain w/ resisted supination or flexion but mild pain with resisted hand extension. Nl grip.   Commons side effects, risks, benefits, and alternatives for medications and treatment plan prescribed today were discussed, and the patient expressed  understanding of the given instructions. Patient is instructed to call or message via MyChart if he/she has any questions or concerns regarding our treatment plan. No barriers to understanding were identified. We discussed Red Flag symptoms and signs in detail. Patient expressed understanding regarding what to do in case of urgent or emergency type symptoms.  Medication list was reconciled, printed and provided to the patient in AVS. Patient instructions and summary information was reviewed with the patient as documented in the AVS. This note was prepared with assistance of Dragon voice recognition software. Occasional wrong-word or sound-a-like substitutions may have occurred due to the inherent limitations of voice recognition software

## 2023-12-10 NOTE — Patient Instructions (Addendum)
 Please return in November for your annual complete physical; please come fasting.   If you have any questions or concerns, please don't hesitate to send me a message via MyChart or call the office at [phone]. Thank you for visiting with [place] today! It's our pleasure caring for you.   VISIT SUMMARY:  Today, you came in for your second shingles shot and to discuss the arm pain you've been experiencing for the past few months. The pain started after moving logs and has been affecting your daily activities.  YOUR PLAN:  -FOREARM STRAIN: A forearm strain is when the muscles in your forearm are overstretched or torn, often due to heavy lifting or repetitive use. To help with the pain and inflammation, you will start taking Diclofenac (Voltaren) 1 pill twice a day for 2 weeks. Additionally, you should begin gentle stretching exercises for your forearm. If you do not see any improvement in 2 weeks, we may consider physical therapy.  -VACCINATIONS: You are due for your second shingles shot, which you will receive today. Additionally, because of your mild intermittent asthma, you qualify for an early pneumococcal vaccine, which you will also receive today. Prevnar 20.Wrist and Forearm Exercises Ask your health care provider which exercises are safe for you. Do exercises exactly as told by your provider and adjust them as directed. It is normal to feel mild stretching, pulling, tightness, or discomfort as you do these exercises. Stop right away if you feel sudden pain or your pain gets worse. Do not begin these exercises until told by your provider. Range-of-motion exercises These exercises warm up your muscles and joints and improve the movement and flexibility of your injured wrist and forearm. These exercises also help to relieve pain, numbness, and tingling. These exercises are done using the muscles in your injured wrist and forearm. Wrist flexion  Bend your left / right elbow to a 90-degree angle  (right angle) with your palm facing the floor. Bend your wrist so that your fingers point toward the floor (flexion). Hold this position for __________ seconds. Slowly return to the starting position. Repeat __________ times. Complete this exercise __________ times a day. Wrist extension  Bend your left / right elbow to a 90-degree angle (right angle) with your palm facing the floor. Bend your wrist so that your fingers point toward the ceiling (extension). Hold this position for __________ seconds. Slowly return to the starting position. Repeat __________ times. Complete this exercise __________ times a day. Ulnar deviation  Bend your left / right elbow to a 90-degree angle (right angle) and rest your forearm on a table with your palm facing down. Keeping your hand flat on the table, bend your left /right wrist toward your small finger (pinkie). This is ulnar deviation. Hold this position for __________ seconds. Slowly return to the starting position. Repeat __________ times. Complete this exercise __________ times a day. Radial deviation  Bend your left / right elbow to a 90-degree angle (right angle) and rest your forearm on a table with your palm facing down. Keeping your hand flat on the table, bend your left / right wrist toward your thumb. This is radial deviation. Hold this position for __________ seconds. Slowly return to the starting position. Repeat __________ times. Complete this exercise __________ times a day. Stretching These exercises warm up your muscles and joints and improve the movement and flexibility of your injured wrist and forearm. These exercises also help to relieve pain, numbness, and tingling. These exercises are done using your  healthy arm to help stretch the muscles in your injured wrist and forearm. Wrist flexion  Reach your left / right arm out in front of you and turn your palm down toward the floor. If told by your provider, bend your left / right  elbow to a 90-degree angle (right angle) at your side. Using your uninjured hand, gently press over the back of your left / right hand to bend your wrist and fingers toward the floor (flexion). Go as far as you can to feel a stretch without causing pain. Hold this position for __________ seconds. Slowly return to the starting position. Repeat __________ times. Complete this exercise __________ times a day. Wrist extension  Reach your left / right arm out in front of you and turn your palm up toward the ceiling. If told by your provider, bend your left / right elbow to a 90-degree angle (right angle) at your side. Using your uninjured hand, gently press over the palm of your left / right hand to bend your wrist and fingers toward the floor (extension). Go as far as you can to feel a stretch without causing pain. Hold this position for __________ seconds. Slowly return to the starting position. Repeat __________ times. Complete this exercise __________ times a day. Forearm rotation, supination  Sit with your left / right elbow bent to a 90-degree angle (right angle). Turn (rotate) your left / right palm up until you cannot rotate it any more (supination). Then, use your other hand to help turn your left / right forearm more. Hold this position for __________ seconds. Slowly return to the starting position. Repeat __________ times. Complete this exercise __________ times a day. Forearm rotation, pronation  Sit with your left / right elbow bent to a 90-degree angle (right angle). Turn (rotate) your left / right palm down (pronation) until you cannot rotate it any more. Then, use your other hand to help turn your left / right forearm more. Hold this position for __________ seconds. Slowly return to the starting position. Repeat __________ times. Complete this exercise __________ times a day. Strengthening exercises These exercises build strength and endurance in your wrist and forearm.  Endurance is the ability to use your muscles for a long time, even after they get tired. Wrist flexion  Sit with your left / right forearm resting on a table or other surface. Bend your elbow to a 90-degree angle (right angle) and rest your hand palm-up over the edge of the table. Hold a __________ weight in your left / right hand or hold a rubber exercise band or tube. Place your left / right hand above the other hand. There should be slight tension in the exercise band or tube. Slowly curl your hand up toward the ceiling (flexion). Hold this position for __________ seconds. Slowly lower your hand back to the starting position. Repeat __________ times. Complete this exercise __________ times a day. Wrist extension  Sit with your left / right forearm resting on a table or other surface. Bend your elbow to a 90-degree angle (right angle) and rest your hand palm-down over the edge of the table. Hold a __________ weight in your left / right hand or hold a rubber exercise band or tube. Place your left / right hand above the other hand. There should be slight tension in the exercise band or tube. Slowly curl your hand up toward the ceiling (extension). Hold this position for __________ seconds. Slowly lower your hand back to the starting position. Repeat  __________ times. Complete this exercise __________ times a day. Forearm rotation, supination  Sit with your left / right forearm resting on a table or other surface. Bend your elbow to a 90-degree angle (right angle). Position your forearm so that your thumb is facing the ceiling (neutral position) and your hand is resting over the edge of the table. Hold a hammer in your left / right hand. This exercise will be easier if you hold the hammer near the head of the hammer. This exercise will be harder if you hold the hammer near the end of the handle. Without moving your elbow, slowly rotate your palm up toward the ceiling (supination). Hold this  position for __________ seconds. Slowly return to the starting position. Repeat __________ times. Complete this exercise __________ times a day. Forearm rotation, pronation  Sit with your left / right forearm resting on a table or other surface. Bend your elbow to a 90-degree angle (right angle). Position your forearm so that the thumb is facing the ceiling (neutral position), with your hand resting over the edge of the table. Hold a hammer in your left / right hand. This exercise will be easier if you hold the hammer near the head of the hammer. This exercise will be harder if you hold the hammer near the end of the handle. Without moving your elbow, slowly rotate your palm down toward the floor (pronation). Hold this position for __________ seconds. Slowly return to the starting position. Repeat __________ times. Complete this exercise __________ times a day. Grip strengthening  Grasp a stress ball or other ball in the middle of your left / right hand. Start with your elbow bent to a 90-degree angle (right angle). Slowly increasing the pressure, squeeze the ball as hard as you can without causing pain. Think of bringing the tips of your fingers into the middle of your palm. All of your finger joints should bend when doing this exercise. To make this exercise harder, slowly try to straighten your elbow in front of you, until you can do the exercise with your elbow fully straight. Hold your squeeze for __________ seconds, then relax. If told by your provider, do this exercise: With your forearm positioned so that the thumb is facing the ceiling (neutral position). With your forearm turned palm-down. With your forearm turned palm-up. Repeat __________ times. Complete this exercise __________ times a day. This information is not intended to replace advice given to you by your health care provider. Make sure you discuss any questions you have with your health care provider. Document Revised:  07/12/2022 Document Reviewed: 07/12/2022 Elsevier Patient Education  2024 [employer]  INSTRUCTIONS:  Please follow up in 2 weeks if your forearm pain does not improve with the current treatment plan. If you experience any adverse reactions to the vaccines, contact our office immediately.

## 2024-03-27 ENCOUNTER — Telehealth: Admitting: Physician Assistant

## 2024-03-27 DIAGNOSIS — B9689 Other specified bacterial agents as the cause of diseases classified elsewhere: Secondary | ICD-10-CM | POA: Diagnosis not present

## 2024-03-27 DIAGNOSIS — J069 Acute upper respiratory infection, unspecified: Secondary | ICD-10-CM | POA: Diagnosis not present

## 2024-03-27 MED ORDER — AZITHROMYCIN 250 MG PO TABS: ORAL_TABLET | ORAL | 0 refills | Status: AC

## 2024-03-27 MED ORDER — PREDNISONE 10 MG (21) PO TBPK: ORAL_TABLET | ORAL | 0 refills | Status: DC

## 2024-03-27 NOTE — Progress Notes (Signed)
 Virtual Visit Consent   [name], you are scheduled for a virtual visit with a [facility] provider today. Just as with appointments in the office, your consent must be obtained to participate. Your consent will be active for this visit and any virtual visit you may have with one of our providers in the next 365 days. If you have a MyChart account, a copy of this consent can be sent to you electronically.  As this is a virtual visit, video technology does not allow for your provider to perform a traditional examination. This may limit your provider's ability to fully assess your condition. If your provider identifies any concerns that need to be evaluated in person or the need to arrange testing (such as labs, EKG, etc.), we will make arrangements to do so. Although advances in technology are sophisticated, we cannot ensure that it will always work on either your end or our end. If the connection with a video visit is poor, the visit may have to be switched to a telephone visit. With either a video or telephone visit, we are not always able to ensure that we have a secure connection.  By engaging in this virtual visit, you consent to the provision of healthcare and authorize for your insurance to be billed (if applicable) for the services provided during this visit. Depending on your insurance coverage, you may receive a charge related to this service.  I need to obtain your verbal consent now. Are you willing to proceed with your visit today? Frederick Martin has provided verbal consent on 03/27/2024 for a virtual visit (video or telephone). Frederick CHRISTELLA Dickinson, PA-C  Date: 03/27/2024 10:10 AM   Virtual Visit via Video Note   I, Frederick Martin, connected with  Frederick Martin  ([id number], [id number]) on 03/27/24 at 10:00 AM EDT by a video-enabled telemedicine application and verified that I am speaking with the correct person using two identifiers.  Location: Patient: Virtual Visit Location  Patient: Home Provider: Virtual Visit Location Provider: Home Office   I discussed the limitations of evaluation and management by telemedicine and the availability of in person appointments. The patient expressed understanding and agreed to proceed.    History of Present Illness: Frederick Martin is a 53 y.o. who identifies as a male who was assigned male at birth, and is being seen today for continued URI symptoms post Covid.  HPI: URI  This is a new problem. Episode onset: Had traveled to [place] on 06/09-06/12; developed symptoms and had Covid; Symptoms worsened and was at the worst on 03/18/24; still having fatigue, raspy voice, gets winded easy, cough. The problem has been gradually worsening. There has been no fever. Associated symptoms include chest pain (mild chest tightness), congestion, coughing (mild), diarrhea, headaches, a plugged ear sensation, rhinorrhea, sinus pain, a sore throat and wheezing (does have asthma). Pertinent negatives include no ear pain, nausea or vomiting. He has tried antihistamine, inhaler use and NSAIDs (zyrtec, advil) for the symptoms. The treatment provided no relief.     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   CHF (congestive heart failure), NYHA class II, chronic, diastolic (HCC) 01/26/2022   S/P MVR (mitral valve repair) 11/11/2021   Essential hypertension 09/27/2020   Male hypogonadism 08/06/2019   Family history of colon cancer in father 04/05/2018   Gastroesophageal reflux disease 03/07/2017   Mild intermittent asthma without complication 03/07/2017   Erectile dysfunction 06/28/2016  MVP (mitral valve prolapse) 03/21/2016   Allergic rhinitis 07/24/2011    Allergies:  Allergies  Allergen Reactions   Losartan  Hives and Itching   Medications:  Current Outpatient Medications:    azithromycin (ZITHROMAX) 250 MG tablet, Take 2 tablets on day 1, then 1 tablet daily on days  2 through 5, Disp: 6 tablet, Rfl: 0   predniSONE (STERAPRED UNI-PAK 21 TAB) 10 MG (21) TBPK tablet, 6 day taper; take as directed on package instructions, Disp: 21 tablet, Rfl: 0   albuterol  (VENTOLIN  HFA) 108 (90 Base) MCG/ACT inhaler, Inhale 1-2 puffs into the lungs every 4 (four) hours as needed., Disp: 18 g, Rfl: 5   diclofenac  (VOLTAREN ) 75 MG EC tablet, Take 1 tablet (75 mg total) by mouth 2 (two) times daily., Disp: 30 tablet, Rfl: 0   empagliflozin  (JARDIANCE ) 10 MG TABS tablet, Take 1 tablet (10 mg total) by mouth daily before breakfast., Disp: 90 tablet, Rfl: 3   esomeprazole (NEXIUM) 20 MG capsule, Take 20 mg by mouth daily at 6 (six) AM., Disp: , Rfl:    furosemide  (LASIX ) 20 MG tablet, TAKE 1 TABLET BY MOUTH DAILY AS  NEEDED FOR A WEIGHT OVER 200 LBS, Disp: 90 tablet, Rfl: 3   metoprolol  succinate (TOPROL  XL) 25 MG 24 hr tablet, Take 1 tablet (25 mg total) by mouth daily., Disp: 90 tablet, Rfl: 3   Multiple Vitamins-Minerals (MULTIVITAMIN WITH MINERALS) tablet, Take 1 tablet by mouth daily., Disp: , Rfl:    sildenafil  (REVATIO ) 20 MG tablet, Take 1 to 5 tablets as needed, Disp: 50 tablet, Rfl: 2   testosterone cypionate (DEPOTESTOSTERONE CYPIONATE) 200 MG/ML injection, Inject into the muscle every 14 (fourteen) days., Disp: , Rfl:    triamcinolone  cream (KENALOG ) 0.1 %, Apply 1 Application topically 2 (two) times daily. For 2 weeks, then as needed, Disp: 45 g, Rfl: 0  Observations/Objective: Patient is well-developed, well-nourished in no acute distress.  Resting comfortably at home.  Head is normocephalic, atraumatic.  No labored breathing.  Speech is clear and coherent with logical content.  Patient is alert and oriented at baseline.    Assessment and Plan: 1. Bacterial upper respiratory infection (Primary) - azithromycin (ZITHROMAX) 250 MG tablet; Take 2 tablets on day 1, then 1 tablet daily on days 2 through 5  Dispense: 6 tablet; Refill: 0 - predniSONE (STERAPRED UNI-PAK 21  TAB) 10 MG (21) TBPK tablet; 6 day taper; take as directed on package instructions  Dispense: 21 tablet; Refill: 0  - Worsening over a week despite OTC medications - Will treat with Z-pack and Prednisone - Can continue Mucinex  if needed - Tylenol  and/or Ibuprofen if needed - Push fluids.  - Rest.  - Steam and humidifier can help - Seek in person evaluation if worsening or symptoms fail to improve    Follow Up Instructions: I discussed the assessment and treatment plan with the patient. The patient was provided an opportunity to ask questions and all were answered. The patient agreed with the plan and demonstrated an understanding of the instructions.  A copy of instructions were sent to the patient via MyChart unless otherwise noted below.    The patient was advised to call back or seek an in-person evaluation if the symptoms worsen or if the condition fails to improve as anticipated.    Frederick CHRISTELLA Dickinson, PA-C

## 2024-03-27 NOTE — Patient Instructions (Signed)
 Frederick Martin, thank you for joining Delon Frederick Dickinson, PA-C for today's virtual visit.  While this provider is not your primary care provider (PCP), if your PCP is located in our provider database this encounter information will be shared with them immediately following your visit.   A [facility] MyChart account gives you access to today's visit and all your visits, tests, and labs performed at [facility]  click here if you don't have a [name] MyChart account or go to [email or web address]  Consent: (Patient) Frederick Martin provided verbal consent for this virtual visit at the beginning of the encounter.  Current Medications:  Current Outpatient Medications:    azithromycin (ZITHROMAX) 250 MG tablet, Take 2 tablets on day 1, then 1 tablet daily on days 2 through 5, Disp: 6 tablet, Rfl: 0   predniSONE (STERAPRED UNI-PAK 21 TAB) 10 MG (21) TBPK tablet, 6 day taper; take as directed on package instructions, Disp: 21 tablet, Rfl: 0   albuterol  (VENTOLIN  HFA) 108 (90 Base) MCG/ACT inhaler, Inhale 1-2 puffs into the lungs every 4 (four) hours as needed., Disp: 18 g, Rfl: 5   diclofenac  (VOLTAREN ) 75 MG EC tablet, Take 1 tablet (75 mg total) by mouth 2 (two) times daily., Disp: 30 tablet, Rfl: 0   empagliflozin  (JARDIANCE ) 10 MG TABS tablet, Take 1 tablet (10 mg total) by mouth daily before breakfast., Disp: 90 tablet, Rfl: 3   esomeprazole (NEXIUM) 20 MG capsule, Take 20 mg by mouth daily at 6 (six) AM., Disp: , Rfl:    furosemide  (LASIX ) 20 MG tablet, TAKE 1 TABLET BY MOUTH DAILY AS  NEEDED FOR A WEIGHT OVER 200 LBS, Disp: 90 tablet, Rfl: 3   metoprolol  succinate (TOPROL  XL) 25 MG 24 hr tablet, Take 1 tablet (25 mg total) by mouth daily., Disp: 90 tablet, Rfl: 3   Multiple Vitamins-Minerals (MULTIVITAMIN WITH MINERALS) tablet, Take 1 tablet by mouth daily., Disp: , Rfl:    sildenafil  (REVATIO ) 20 MG tablet, Take 1 to 5 tablets as needed, Disp: 50 tablet, Rfl: 2    testosterone cypionate (DEPOTESTOSTERONE CYPIONATE) 200 MG/ML injection, Inject into the muscle every 14 (fourteen) days., Disp: , Rfl:    triamcinolone  cream (KENALOG ) 0.1 %, Apply 1 Application topically 2 (two) times daily. For 2 weeks, then as needed, Disp: 45 g, Rfl: 0   Medications ordered in this encounter:  Meds ordered this encounter  Medications   azithromycin (ZITHROMAX) 250 MG tablet    Sig: Take 2 tablets on day 1, then 1 tablet daily on days 2 through 5    Dispense:  6 tablet    Refill:  0    Supervising Provider:   LAMPTEY, PHILIP Martin [[id number]]   predniSONE (STERAPRED UNI-PAK 21 TAB) 10 MG (21) TBPK tablet    Sig: 6 day taper; take as directed on package instructions    Dispense:  21 tablet    Refill:  0    Supervising Provider:   BLAISE ALEENE Martin [[id number]]     *If you need refills on other medications prior to your next appointment, please contact your pharmacy*  Follow-Up: Call back or seek an in-person evaluation if the symptoms worsen or if the condition fails to improve as anticipated.  [facility] [phone]  Other Instructions  Upper Respiratory Infection, Adult An upper respiratory infection (URI) is a common viral infection of the nose, throat, and upper air passages that lead to the lungs. The most common type of URI is the  common cold. URIs usually get better on their own, without medical treatment. What are the causes? A URI is caused by a virus. You may catch a virus by: Breathing in droplets from an infected person's cough or sneeze. Touching something that has been exposed to the virus (is contaminated) and then touching your mouth, nose, or eyes. What increases the risk? You are more likely to get a URI if: You are very young or very old. You have close contact with others, such as at work, school, or a health care facility. You smoke. You have long-term (chronic) heart or lung disease. You have a weakened disease-fighting system  (immune system). You have nasal allergies or asthma. You are experiencing a lot of stress. You have poor nutrition. What are the signs or symptoms? A URI usually involves some of the following symptoms: Runny or stuffy (congested) nose. Cough. Sneezing. Sore throat. Headache. Fatigue. Fever. Loss of appetite. Pain in your forehead, behind your eyes, and over your cheekbones (sinus pain). Muscle aches. Redness or irritation of the eyes. Pressure in the ears or face. How is this diagnosed? This condition may be diagnosed based on your medical history and symptoms, and a physical exam. Your health care provider may use a swab to take a mucus sample from your nose (nasal swab). This sample can be tested to determine what virus is causing the illness. How is this treated? URIs usually get better on their own within 7-10 days. Medicines cannot cure URIs, but your health care provider may recommend certain medicines to help relieve symptoms, such as: Over-the-counter cold medicines. Cough suppressants. Coughing is a type of defense against infection that helps to clear the respiratory system, so take these medicines only as recommended by your health care provider. Fever-reducing medicines. Follow these instructions at home: Activity Rest as needed. If you have a fever, stay home from work or school until your fever is gone or until your health care provider says your URI cannot spread to other people (is no longer contagious). Your health care provider may have you wear a face mask to prevent your infection from spreading. Relieving symptoms Gargle with a mixture of salt and water 3-4 times a day or as needed. To make salt water, completely dissolve -1 tsp (3-6 g) of salt in 1 cup (237 mL) of warm water. Use a cool-mist humidifier to add moisture to the air. This can help you breathe more easily. Eating and drinking  Drink enough fluid to keep your urine pale yellow. Eat soups and other  clear broths. General instructions  Take over-the-counter and prescription medicines only as told by your health care provider. These include cold medicines, fever reducers, and cough suppressants. Do not use any products that contain nicotine or tobacco. These products include cigarettes, chewing tobacco, and vaping devices, such as e-cigarettes. If you need help quitting, ask your health care provider. Stay away from secondhand smoke. Stay up to date on all immunizations, including the yearly (annual) flu vaccine. Keep all follow-up visits. This is important. How to prevent the spread of infection to others URIs can be contagious. To prevent the infection from spreading: Wash your hands with soap and water for at least 20 seconds. If soap and water are not available, use hand sanitizer. Avoid touching your mouth, face, eyes, or nose. Cough or sneeze into a tissue or your sleeve or elbow instead of into your hand or into the air.  Contact a health care provider if: You are getting  worse instead of better. You have a fever or chills. Your mucus is brown or red. You have yellow or brown discharge coming from your nose. You have pain in your face, especially when you bend forward. You have swollen neck glands. You have pain while swallowing. You have white areas in the back of your throat. Get help right away if: You have shortness of breath that gets worse. You have severe or persistent: Headache. Ear pain. Sinus pain. Chest pain. You have chronic lung disease along with any of the following: Making high-pitched whistling sounds when you breathe, most often when you breathe out (wheezing). Prolonged cough (more than 14 days). Coughing up blood. A change in your usual mucus. You have a stiff neck. You have changes in your: Vision. Hearing. Thinking. Mood. These symptoms may be an emergency. Get help right away. Call 911. Do not wait to see if the symptoms will go away. Do not  drive yourself to the hospital. Summary An upper respiratory infection (URI) is a common infection of the nose, throat, and upper air passages that lead to the lungs. A URI is caused by a virus. URIs usually get better on their own within 7-10 days. Medicines cannot cure URIs, but your health care provider may recommend certain medicines to help relieve symptoms. This information is not intended to replace advice given to you by your health care provider. Make sure you discuss any questions you have with your health care provider. Document Revised: 04/20/2021 Document Reviewed: 04/20/2021 Elsevier Patient Education  2024 Elsevier Inc.   If you have been instructed to have an in-person evaluation today at a local Urgent Care facility, please use the link below. It will take you to a list of all of our available [facility], including address, phone number and hours of operation. Please do not delay care.  [facility]  If you or a family member do not have a primary care provider, use the link below to schedule a visit and establish care. When you choose a [facility] primary care physician or advanced practice provider, you gain a long-term partner in health. Find a Primary Care Provider  Learn more about [facility]'s in-office and virtual care options: [facility] - Get Care Now

## 2024-04-01 ENCOUNTER — Ambulatory Visit: Payer: Self-pay

## 2024-04-01 NOTE — Telephone Encounter (Signed)
 FYI Only or Action Required?: FYI only for provider.  Patient was last seen in primary care on 12/10/2023 by Jodie Lavern CROME, MD. Called Nurse Triage reporting Neurologic Problem. Symptoms began several days ago. Interventions attempted: Rest, hydration, or home remedies. Symptoms are: stable.  Triage Disposition: See PCP When Office is Open (Within 3 Days)  Patient/caregiver understands and will follow disposition?: Yes  Copied from CRM [id number]. Topic: Clinical - Red Word Triage >> Apr 01, 2024 10:07 AM Robinson H wrote: [facility] that prompted transfer to Nurse Triage: Friday had an episode, right side of body had stroke like symptoms and vision issues on occasion Reason for Disposition  [1] Weakness of arm / hand, or leg / foot AND [2] is a chronic symptom (recurrent or ongoing AND present > 4 weeks)    Episode occurred on Friday  Answer Assessment - Initial Assessment Questions Pt has no abnormal neurologic s/s present upon assessment, no chest pain/tightness, no difficulty breathing, no headaches, no weakness/numb/tingles. Pt made an appmt with PCP first thing in morning at 8AM d/t Friday episodes.  1. SYMPTOM: What is the main symptom you are concerned about? (e.g., weakness, numbness)     s/s:  noted R side of face went numb; R arm was in weird position; speech was off. 2. ONSET: When did this start? (minutes, hours, days; while sleeping)     Last Friday has stroke s/s:  R side of face went numb; posture - R arm was in weird position; speech was off. Did not see anyone.  - Nothing since Friday 3. LAST NORMAL: When was the last time you (the patient) were normal (no symptoms)?     Normal since Friday episode  4. PATTERN Does this come and go, or has it been constant since it started?  Is it present now?     First times ever happening to pt 5. CARDIAC SYMPTOMS: Have you had any of the following symptoms: chest pain, difficulty breathing, palpitations?     No chest pain/tight;  no diff breathing; no heart palpitations.   6. NEUROLOGIC SYMPTOMS: Have you had any of the following symptoms: headache, dizziness, vision loss, double vision, changes in speech, unsteady on your feet?     Since Friday - no H/A's; has had double vision for over a year now; no weakness since Friday. Numb/tingle to R leg last week. 7. OTHER SYMPTOMS: Do you have any other symptoms?     Cross eyed vision - happens daily and began a year ago.  Has seen MD Jodie for vision before.  Protocols used: Neurologic Deficit-A-AH

## 2024-04-01 NOTE — Telephone Encounter (Signed)
 Appt tomorrow.

## 2024-04-02 ENCOUNTER — Encounter: Payer: Self-pay | Admitting: Family Medicine

## 2024-04-02 ENCOUNTER — Ambulatory Visit (INDEPENDENT_AMBULATORY_CARE_PROVIDER_SITE_OTHER): Admitting: Family Medicine

## 2024-04-02 VITALS — BP 116/80 | HR 71 | Temp 98.1°F | Ht 71.0 in | Wt 211.8 lb

## 2024-04-02 DIAGNOSIS — R200 Anesthesia of skin: Secondary | ICD-10-CM | POA: Diagnosis not present

## 2024-04-02 DIAGNOSIS — R29810 Facial weakness: Secondary | ICD-10-CM

## 2024-04-02 DIAGNOSIS — H532 Diplopia: Secondary | ICD-10-CM

## 2024-04-02 DIAGNOSIS — R29898 Other symptoms and signs involving the musculoskeletal system: Secondary | ICD-10-CM

## 2024-04-02 NOTE — Patient Instructions (Signed)
 Please follow up if symptoms do not improve or as needed.     VISIT SUMMARY: Today, we discussed your recent episodes of right-sided numbness, facial droop, and blurry vision. We also reviewed your history of heart failure and mitral valve repair, as well as your recovery from a COVID-19 infection.  YOUR PLAN: -POSSIBLE TRANSIENT ISCHEMIC ATTACK (TIA): A TIA is a temporary period of symptoms similar to those of a stroke. It happens when the blood supply to part of the brain is briefly interrupted. You experienced symptoms like right-sided facial droop, arm weakness, and lightheadedness, which resolved quickly. We need to evaluate this further with a neurology consultation and review your previous MRI. You are scheduled for carotid ultrasounds to look for blockages. You may need another brain MRI but I want the neurologist to evaluate first. Go directly to the ER for any new neurologic symptoms. Start a baby aspirin  daily for now.   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 You have residual sinus drainage and a mild cough. Supportive care will be provided as needed.  -MITRAL VALVE REPAIR: You have a history of mitral valve repair and heart failure. Currently, there are no issues with your heart valve. Continue with your annual follow-ups with cardiology to monitor your heart health.  INSTRUCTIONS: Please schedule an appointment with a neurologist for further evaluation of your symptoms. Also, ensure you continue with your annual cardiology follow-ups. If you have not had a carotid and cardiac ultrasound recently, we may need to arrange these tests.                      Contains text generated by Abridge.                                 Contains text generated by Abridge.

## 2024-04-02 NOTE — Progress Notes (Signed)
 Subjective  CC:  Chief Complaint  Patient presents with   face issues    Pt stated that last Friday the rt side od his face went numb and was not moving when he spoke and his vision is blurred in both eyes. Rt leg went numb as well    HPI: Frederick Martin is a 53 y.o. male who presents to the office today to address the problems listed above in the chief complaint. Discussed the use of AI scribe software for clinical note transcription with the patient, who gave verbal consent to proceed.  History of Present Illness Arris Martin is a 53 year old male who presents with episodes of right-sided numbness and facial droop.  Past medical history significant for heart failure, status post mitral valve repair with recent echocardiogram in February showing good results and EF of 50%, recurrent and persistent binocular diplopia that happens 2-3 times daily.  Negative neurology workup last year by Dr. Emelia Stanford.  Reviewed MRI of the brain done in June of last year.  On Friday, after sitting for about three to four minutes while waiting for a haircut, he experienced numbness in his entire right leg, described as 'dead weight', lasting about a minute before resolving. He was able to walk normally after the episode.  Later that day, while at his girlfriend's house, he experienced lightheadedness, and his girlfriend noted that the right side of his face was not responsive and he was holding his right arm awkwardly. This episode lasted a couple of minutes. He was aware that something was happening but was unsure of the extent.  He reports what sounds like to be a facial droop and some possible cognitive changes.  He reports his speech was not normal.  He denies swallowing problems or double vision at that time.  He was also aware that his right arm did not feel normal but he denies true weakness or paresthesias.  The symptoms lasted only minutes.  He did not seek medical attention at that time.  Since  then, he feels back to his baseline.  He continues to have double vision 2-3 times per day for unclear reasons.  Of note, he has a history of COVID-19 infection in early June; due to persistent symptoms he had a virtual visit and was treated with antibiotics and prednisone , with significant improvement post-treatment, though he still experiences some sinus drainage and a mild cough.  He denies swelling or pain in his calves, shortness of breath, chest pain, palpitations, fevers or chills, altered mental status, dysuria or abdominal pain. He is treated for hypotestosteronism with testosterone injections.  Lab Results  Component Value Date   WBC 6.3 08/07/2023   HGB 16.7 08/07/2023   HCT 51.9 08/07/2023   MCV 87.0 08/07/2023   PLT 277.0 08/07/2023       Assessment  1. Right leg numbness   2. Binocular vision disorder with diplopia   3. Right arm weakness   4. Facial droop      Plan  Assessment and Plan Assessment & Plan Transient Ischemic Attack (TIA) like symptoms. Experienced transient right-sided facial droop, arm weakness, and lightheadedness, suggestive of TIA.   Fortunately symptoms resolved quickly. Ongoing blurry vision episodes. Differential includes TIA, MS, and focal seizures. TIA most concerning. Neurology evaluation needed. 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 - Refer to neurology for evaluation and management.  May warrant another brain MRI.  May warrant repeat echo - Review previous imaging studies, including MRI  from June last year. - Carotid ultrasounds ordered. - To emergency room for any further episodes of neurologic symptoms.  Patient agrees - Neuroexam is normal now - Testosterone replacement therapy per specialist.  I would consider holding this for now. - Recommend starting baby aspirin  while workup ongoing.  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 - Provide supportive care as needed. - COVID  can be a hypercoagulable state.  Mitral Valve Repair Mitral valve repair with heart failure, no current valvular issues. Annual cardiology follow-up maintained. - Continue annual cardiology follow-up.  Follow up: prn Orders Placed This Encounter  Procedures   US  Carotid Duplex Bilateral   Ambulatory referral to Neurology   VAS US  CAROTID   No orders of the defined types were placed in this encounter.    I reviewed the patients updated PMH, FH, and SocHx.  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    Priority: High   CHF (congestive heart failure), NYHA class II, chronic, diastolic (HCC) 01/26/2022    Priority: High   S/P MVR (mitral valve repair) 11/11/2021    Priority: High   Essential hypertension 09/27/2020    Priority: High   Family history of colon cancer in father 04/05/2018    Priority: High   Male hypogonadism 08/06/2019    Priority: Medium    Gastroesophageal reflux disease 03/07/2017    Priority: Medium    Mild intermittent asthma without complication 03/07/2017    Priority: Medium    Erectile dysfunction 06/28/2016    Priority: Medium    MVP (mitral valve prolapse) 03/21/2016    Priority: Medium    Allergic rhinitis 07/24/2011    Priority: Low   Current Meds  Medication Sig   albuterol  (VENTOLIN  HFA) 108 (90 Base) MCG/ACT inhaler Inhale 1-2 puffs into the lungs every 4 (four) hours as needed.   diclofenac  (VOLTAREN ) 75 MG EC tablet Take 1 tablet (75 mg total) by mouth 2 (two) times daily.   empagliflozin  (JARDIANCE ) 10 MG TABS tablet Take 1 tablet (10 mg total) by mouth daily before breakfast.   esomeprazole (NEXIUM) 20 MG capsule Take 20 mg by mouth daily at 6 (six) AM.   furosemide  (LASIX ) 20 MG tablet TAKE 1 TABLET BY MOUTH DAILY AS  NEEDED FOR A WEIGHT OVER 200 LBS   metoprolol  succinate (TOPROL  XL) 25 MG 24 hr tablet Take 1 tablet (25 mg total) by mouth daily.   Multiple Vitamins-Minerals  (MULTIVITAMIN WITH MINERALS) tablet Take 1 tablet by mouth daily.   sildenafil  (REVATIO ) 20 MG tablet Take 1 to 5 tablets as needed   testosterone cypionate (DEPOTESTOSTERONE CYPIONATE) 200 MG/ML injection Inject into the muscle every 14 (fourteen) days.   triamcinolone  cream (KENALOG ) 0.1 % Apply 1 Application topically 2 (two) times daily. For 2 weeks, then as needed   Allergies: Patient is allergic to losartan . Family History: Patient family history includes Cancer in his maternal grandmother; Colon cancer in his father and paternal grandfather; Depression in his father and mother; Diabetes in his father; Esophageal cancer in his maternal grandfather; Heart disease in his father; Hyperlipidemia in his father; Hypertension in his father. Social History:  Patient  reports that he has never smoked. He has never used smokeless tobacco. He reports current alcohol use. He reports that he does not use drugs.  Review of Systems: Constitutional: Negative for fever malaise or anorexia Cardiovascular: negative for chest pain Respiratory: negative for SOB or  persistent cough Gastrointestinal: negative for abdominal pain  Objective  Vitals: BP 116/80   Pulse 71   Temp 98.1 F (36.7 C)   Ht 5' 11 (1.803 m)   Wt 211 lb 12.8 oz (96.1 kg)   SpO2 94%   BMI 29.54 kg/m  General: no acute distress , A&Ox3 HEENT: PEERL, conjunctiva normal, neck is supple Cardiovascular:  RRR without murmur or gallop.  Respiratory:  Good breath sounds bilaterally, CTAB with normal respiratory effort Skin:  Warm, no rashes Neuro: Cranial nerves II through XII intact, nonfocal neuroexam  Commons side effects, risks, benefits, and alternatives for medications and treatment plan prescribed today were discussed, and the patient expressed understanding of the given instructions. Patient is instructed to call or message via MyChart if he/she has any questions or concerns regarding our treatment plan. No barriers to  understanding were identified. We discussed Red Flag symptoms and signs in detail. Patient expressed understanding regarding what to do in case of urgent or emergency type symptoms.  Medication list was reconciled, printed and provided to the patient in AVS. Patient instructions and summary information was reviewed with the patient as documented in the AVS. This note was prepared with assistance of Dragon voice recognition software. Occasional wrong-word or sound-a-like substitutions may have occurred due to the inherent limitations of voice recognition software

## 2024-04-03 ENCOUNTER — Ambulatory Visit (HOSPITAL_COMMUNITY)
Admission: RE | Admit: 2024-04-03 | Discharge: 2024-04-03 | Disposition: A | Discharge status: Home / Self Care | Source: Ambulatory Visit | Attending: Family Medicine | Admitting: Family Medicine

## 2024-04-03 DIAGNOSIS — H532 Diplopia: Secondary | ICD-10-CM

## 2024-04-03 DIAGNOSIS — R29898 Other symptoms and signs involving the musculoskeletal system: Secondary | ICD-10-CM

## 2024-04-03 DIAGNOSIS — R29810 Facial weakness: Secondary | ICD-10-CM | POA: Diagnosis not present

## 2024-04-03 DIAGNOSIS — R200 Anesthesia of skin: Secondary | ICD-10-CM

## 2024-04-04 ENCOUNTER — Ambulatory Visit: Payer: Self-pay | Admitting: Family Medicine

## 2024-04-04 NOTE — Progress Notes (Signed)
 See mychart note Dear Mr. Gallo, Your carotid artery ultrasound shows normal results.  Sincerely, Dr. Jodie

## 2024-04-07 ENCOUNTER — Encounter: Payer: Self-pay | Admitting: Cardiovascular Disease

## 2024-04-07 DIAGNOSIS — G459 Transient cerebral ischemic attack, unspecified: Secondary | ICD-10-CM

## 2024-04-07 NOTE — Telephone Encounter (Signed)
 Please order 30 day event monitor for TIA ( does not have to be live).

## 2024-04-18 ENCOUNTER — Encounter: Payer: Self-pay | Admitting: Neurology

## 2024-04-18 ENCOUNTER — Ambulatory Visit: Admitting: Neurology

## 2024-04-18 VITALS — BP 113/82 | HR 82 | Ht 71.0 in | Wt 214.0 lb

## 2024-04-18 DIAGNOSIS — H532 Diplopia: Secondary | ICD-10-CM

## 2024-04-18 DIAGNOSIS — Z9889 Other specified postprocedural states: Secondary | ICD-10-CM

## 2024-04-18 DIAGNOSIS — R531 Weakness: Secondary | ICD-10-CM | POA: Diagnosis not present

## 2024-04-18 DIAGNOSIS — I5032 Chronic diastolic (congestive) heart failure: Secondary | ICD-10-CM

## 2024-04-18 DIAGNOSIS — G459 Transient cerebral ischemic attack, unspecified: Secondary | ICD-10-CM | POA: Diagnosis not present

## 2024-04-18 NOTE — Progress Notes (Signed)
 [facility]  PATIENT: Frederick Martin DOB: 16-Jun-1971  REFERRING DOCTOR OR PCP: Lavern Heck, MD SOURCE: Patient, notes from primary care,  _________________________________   HISTORICAL  CHIEF COMPLAINT:  Chief Complaint  Patient presents with   New Patient (Initial Visit)    Room 10 pt is alone, about 1 week  ago, he was sitting at home notice a change in speck , weakness on right , and his arm numbness and limp , pt stated that this hasn't happen since, pt is wear a heart monitor that was given by cardiology     HISTORY OF PRESENT ILLNESS:  I had the pleasure seeing your patient, Frederick Martin, at [facility] for neurologic consultation regarding his diplopia.  About 3 weeks ago while on the couch talking he noted difficulty with speech (slowed slurred speech and maybe expressive aphasia) and right hand changed tone.   When he smile the right side was not as expressive.    The entire episode was just 30 seconds.  He was then completely at baseline.    Heart was not racing.      We had done MRI 03/2023 and the brain was normal for age (2 punctate foci).         He continues to have fluctuating diplopia - no identified trigger.   These started February 2023.   He had a MVR at [facility] and noted dome diplopia a couple weeks later.   Episode of diplopia x 2-3 minutes occur once r twice most days.   The diplopia is skewed.  The diplopia is binocular.  He does notes vision is better OS than OD.    He saw ophthalmology and was told he had a noral exam.     At the same time as the diplopia, he will note balance is reduced and he prefers to sit.  Sometimes he notes tingling in his legs.    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  On metoprolol , Lasix  and Jardiance  for mild CHF.  (Class II diastolic).  He is wearing a cardiac monitor (30 d just stated).   He just started  bASA    Carotid U/S is pending but velocities look within normal range.    Sometimes he experiences headaches but they sem unrelated to the spells for the most part.   However some HA seem to occur if his vision is worse.      He denies DM, HTN.   He denies trauma.  Imaging: MRI of the brain 03/14/2023 showed two small T2/FLAIR hyperintense foci, 1 in the right frontal lobe and 1 in the left parietal lobe.  They do not enhance and are not acute.  These are nonspecific and most likely represents very minimal chronic microvascular ischemic changes, sequela of migraine.  Demyelination is less likely..   Chronic inflammatory changes in the left frontal sinus, left hemisphenoid and both maxillary sinuses.  REVIEW OF SYSTEMS: Constitutional: No fevers, chills, sweats, or change in appetite Eyes: No visual changes,.  Double vision as above.  No eye pain Ear, nose and throat: No hearing loss, ear pain, nasal congestion, sore throat Cardiovascular: No chest pain, palpitations Respiratory:  No shortness of breath at rest or with exertion.   No wheezes GastrointestinaI: No nausea, vomiting, diarrhea, abdominal pain, fecal incontinence Genitourinary:  No dysuria, urinary retention or frequency.  No  nocturia. Musculoskeletal:  No neck pain, back pain Integumentary: No rash, pruritus, skin lesions Neurological: as above Psychiatric: No depression at this time.  No anxiety Endocrine: No palpitations, diaphoresis, change in appetite, change in weigh or increased thirst Hematologic/Lymphatic:  No anemia, purpura, petechiae. Allergic/Immunologic: No itchy/runny eyes, nasal congestion, recent allergic reactions, rashes  ALLERGIES: Allergies  Allergen Reactions   Losartan  Hives and Itching    HOME MEDICATIONS:  Current Outpatient Medications:    albuterol  (VENTOLIN  HFA) 108 (90 Base) MCG/ACT inhaler, Inhale 1-2 puffs into the lungs every 4 (four) hours as needed., Disp: 18 g, Rfl: 5   diclofenac   (VOLTAREN ) 75 MG EC tablet, Take 1 tablet (75 mg total) by mouth 2 (two) times daily., Disp: 30 tablet, Rfl: 0   empagliflozin  (JARDIANCE ) 10 MG TABS tablet, Take 1 tablet (10 mg total) by mouth daily before breakfast., Disp: 90 tablet, Rfl: 3   esomeprazole (NEXIUM) 20 MG capsule, Take 20 mg by mouth daily at 6 (six) AM., Disp: , Rfl:    furosemide  (LASIX ) 20 MG tablet, TAKE 1 TABLET BY MOUTH DAILY AS  NEEDED FOR A WEIGHT OVER 200 LBS, Disp: 90 tablet, Rfl: 3   metoprolol  succinate (TOPROL  XL) 25 MG 24 hr tablet, Take 1 tablet (25 mg total) by mouth daily., Disp: 90 tablet, Rfl: 3   Multiple Vitamins-Minerals (MULTIVITAMIN WITH MINERALS) tablet, Take 1 tablet by mouth daily., Disp: , Rfl:    sildenafil  (REVATIO ) 20 MG tablet, Take 1 to 5 tablets as needed, Disp: 50 tablet, Rfl: 2   testosterone cypionate (DEPOTESTOSTERONE CYPIONATE) 200 MG/ML injection, Inject into the muscle every 14 (fourteen) days., Disp: , Rfl:    triamcinolone  cream (KENALOG ) 0.1 %, Apply 1 Application topically 2 (two) times daily. For 2 weeks, then as needed, Disp: 45 g, Rfl: 0  PAST MEDICAL HISTORY: Past Medical History:  Diagnosis Date   Asthma    GERD (gastroesophageal reflux disease)    Heart murmur    Hypertension    MVP (mitral valve prolapse)     PAST SURGICAL HISTORY: Past Surgical History:  Procedure Laterality Date   RIGHT/LEFT HEART CATH AND CORONARY ANGIOGRAPHY N/A 09/08/2021   Procedure: RIGHT/LEFT HEART CATH AND CORONARY ANGIOGRAPHY;  Surgeon: Verlin Lonni BIRCH, MD;  Location: [facility] INVASIVE CV LAB;  Service: Cardiovascular;  Laterality: N/A;   TEE WITHOUT CARDIOVERSION N/A 09/08/2021   Procedure: TRANSESOPHAGEAL ECHOCARDIOGRAM (TEE);  Surgeon: Delford Maude BROCKS, MD;  Location: [facility] ENDOSCOPY;  Service: Cardiovascular;  Laterality: N/A;   WISDOM TOOTH EXTRACTION  2001    FAMILY HISTORY: Family History  Problem Relation Age of Onset   Depression Mother    Depression Father    Diabetes Father     Heart disease Father    Hyperlipidemia Father    Hypertension Father    Colon cancer Father    Cancer Maternal Grandmother    Colon cancer Paternal Grandfather    Esophageal cancer Maternal Grandfather    Stomach cancer Neg Hx    Rectal cancer Neg Hx     SOCIAL HISTORY: Social History   Socioeconomic History   Marital status: Divorced    Spouse name: Not on file   Number of children: 3   Years of education: Not on file   Highest education level: Associate degree: occupational, Scientist, product/process development, or vocational program  Occupational History   Occupation: Associate Professor  Tobacco Use   Smoking status: Never   Smokeless tobacco: Never  Vaping Use   Vaping status: Never Used  Substance and Sexual Activity   Alcohol use: Yes   Drug use: Never   Sexual activity: Yes  Other Topics Concern   Not on file  Social History Narrative   Right Handed   2-3 Cups of Coffee per Day   Soda when he goes out to eat   Social Drivers of Health   Financial Resource Strain: Low Risk  (04/01/2024)   Overall Financial Resource Strain (CARDIA)    Difficulty of Paying Living Expenses: Not very hard  Food Insecurity: No Food Insecurity (04/01/2024)   Hunger Vital Sign    Worried About Running Out of Food in the Last Year: Never true    Ran Out of Food in the Last Year: Never true  Transportation Needs: No Transportation Needs (04/01/2024)   PRAPARE - Administrator, Civil Service (Medical): No    Lack of Transportation (Non-Medical): No  Physical Activity: Insufficiently Active (04/01/2024)   Exercise Vital Sign    Days of Exercise per Week: 2 days    Minutes of Exercise per Session: 40 min  Stress: No Stress Concern Present (04/01/2024)   [employer] of Occupational Health - Occupational Stress Questionnaire    Feeling of Stress: Not at all  Social Connections: Socially Isolated (04/01/2024)   Social Connection and Isolation Panel    Frequency of Communication with Friends and  Family: Twice a week    Frequency of Social Gatherings with Friends and Family: Twice a week    Attends Religious Services: Never    Database administrator or Organizations: No    Attends Engineer, structural: Not on file    Marital Status: Divorced  Intimate Partner Violence: Unknown (01/04/2022)   Received from [facility]   HITS    Physically Hurt: Not on file    Insult or Talk Down To: Not on file    Threaten Physical Harm: Not on file    Scream or Curse: Not on file       PHYSICAL EXAM  Vitals:   04/18/24 0915  BP: 113/82  Pulse: 82  Weight: 214 lb (97.1 kg)  Height: 5' 11 (1.803 m)    Body mass index is 29.85 kg/m.   General: The patient is well-developed and well-nourished and in no acute distress  HEENT:  Head is Harvey/AT.  Sclera are anicteric.  Funduscopic exam shows normal optic discs and retinal vessels.  Neck: No carotid bruits are noted.  The neck is nontender.  Cardiovascular: The heart has a regular rate and rhythm with a normal S1 and S2. There were no murmurs, gallops or rubs.    Skin: Extremities are without rash or  edema.  Musculoskeletal:  Back is nontender  Neurologic Exam  Mental status: The patient is alert and oriented x 3 at the time of the examination. The patient has apparent normal recent and remote memory, with an apparently normal attention span and concentration ability.   Speech is normal.  Cranial nerves: Extraocular movements are full. Pupils are equal, round, and reactive to light and accomodation.  Visual fields are full.  Facial symmetry is present. There is good facial sensation to soft touch bilaterally.Facial strength is normal.  Trapezius and sternocleidomastoid strength is normal. No dysarthria is noted.  The tongue is midline, and the patient has symmetric elevation of the soft palate. No obvious hearing deficits are noted.  Motor:  Muscle bulk is normal.   Tone is normal. Strength is  5 / 5 in all  4 extremities.    Sensory: Sensory testing is intact to pinprick, soft touch and vibration sensation in all 4 extremities.  Coordination: Cerebellar testing reveals good finger-nose-finger and heel-to-shin bilaterally.  Gait and station: Station is normal.   Gait is normal. Tandem gait is wide. Romberg is negative.   Reflexes: Deep tendon reflexes are symmetric and normal bilaterally.   Plantar responses are flexor.    DIAGNOSTIC DATA (LABS, IMAGING, TESTING) - I reviewed patient records, labs, notes, testing and imaging myself where available.  Lab Results  Component Value Date   WBC 6.3 08/07/2023   HGB 16.7 08/07/2023   HCT 51.9 08/07/2023   MCV 87.0 08/07/2023   PLT 277.0 08/07/2023      Component Value Date/Time   NA 139 11/14/2023 0952   K 4.9 11/14/2023 0952   CL 101 11/14/2023 0952   CO2 24 11/14/2023 0952   GLUCOSE 84 11/14/2023 0952   GLUCOSE 96 08/07/2023 0900   BUN 12 11/14/2023 0952   CREATININE 1.10 11/14/2023 0952   CREATININE 0.93 05/18/2020 1034   CALCIUM 9.5 11/14/2023 0952   PROT 7.3 08/07/2023 0900   ALBUMIN 4.3 08/07/2023 0900   AST 24 08/07/2023 0900   ALT 28 08/07/2023 0900   ALKPHOS 29 (L) 08/07/2023 0900   BILITOT 0.6 08/07/2023 0900   GFRNONAA >60 09/06/2021 2023   Lab Results  Component Value Date   CHOL 146 08/07/2023   HDL 40.80 08/07/2023   LDLCALC 73 08/07/2023   TRIG 159.0 (H) 08/07/2023   CHOLHDL 4 08/07/2023   No results found for: HGBA1C Lab Results  Component Value Date   VITAMINB12 664 05/18/2020   Lab Results  Component Value Date   TSH 1.71 08/07/2023       ASSESSMENT AND PLAN  TIA (transient ischemic attack)  Right sided weakness  Diplopia  CHF (congestive heart failure), NYHA class II, chronic, diastolic (HCC)  S/P MVR (mitral valve repair)   Likely TIA.  I am more concerned about cardiac etiology - carotid doppler looks ok (by my unofficial read), wearing heart monitor   --- if another event MRI brain and  MRA Contiue bASA Stay active Rtc prn new or worsening symptoms  Adonias Demore A. Vear, MD, [facility] 04/18/2024, 10:10 AM Certified in Neurology, Clinical Neurophysiology, Sleep Medicine and Neuroimaging  [facility] [address], [address] [place], [place] [address] [phone]

## 2024-04-30 ENCOUNTER — Encounter: Payer: Self-pay | Admitting: Family Medicine

## 2024-05-01 ENCOUNTER — Other Ambulatory Visit: Payer: Self-pay

## 2024-05-01 DIAGNOSIS — M79631 Pain in right forearm: Secondary | ICD-10-CM

## 2024-05-02 ENCOUNTER — Other Ambulatory Visit: Payer: Self-pay | Admitting: Cardiovascular Disease

## 2024-05-09 NOTE — Progress Notes (Signed)
 Ben Annisha Baar D.[facility] [address] [place] [address] Phone: [phone]   Assessment and Plan:    1. Right forearm pain 2. Medial epicondylitis of elbow, right -Chronic with exacerbation, initial visit - Consistent with medial epicondylitis of right elbow originally flared 6 months ago with heavy lifting, and recurrent flares with fine motor movement of upper extremity - Start HEP for golfers elbow - Recommend using wrist brace without thumb spica for physical activities that flare pain - Start meloxicam  15 mg daily x2 weeks.  If still having pain after 2 weeks, complete 3rd-week of NSAID. May use remaining NSAID as needed once daily for pain control.  Do not to use additional over-the-counter NSAIDs (ibuprofen, naproxen , Advil, Aleve , etc.) while taking prescription NSAIDs.  May use Tylenol  [phone] mg 2 to 3 times a day for breakthrough pain. -X-ray obtained in clinic.  Interpretation: No acute fracture or dislocation  15 additional minutes spent for educating Therapeutic Home Exercise Program.  This included exercises focusing on stretching, strengthening, with focus on eccentric aspects.   Long term goals include an improvement in range of motion, strength, endurance as well as avoiding reinjury. Patient's frequency would include in 1-2 times a day, 3-5 times a week for a duration of 6-12 weeks. Proper technique shown and discussed handout in great detail with ATC.  All questions were discussed and answered.     Pertinent previous records reviewed include none  Follow Up: 4 weeks for reevaluation.  If no improvement or worsening of symptoms, could consider physical therapy versus prolonged bracing versus needling versus ECSWT   Subjective:   I, Frederick Martin, am serving as a Neurosurgeon for Doctor Morene Mace  Chief Complaint: right arm pain   HPI:   05/12/2024 Patient is a 53 year old male with right arm pain. Patient states that  6 months ago he injured it moving logs. Pain over medial epicondyle and into his the flexors. Denies any pain with using keyboard or phone. Denies any numbness or tingling.   Relevant Historical Information: None pertinent  Additional pertinent review of systems negative.   Current Outpatient Medications:    albuterol  (VENTOLIN  HFA) 108 (90 Base) MCG/ACT inhaler, Inhale 1-2 puffs into the lungs every 4 (four) hours as needed., Disp: 18 g, Rfl: 5   diclofenac  (VOLTAREN ) 75 MG EC tablet, Take 1 tablet (75 mg total) by mouth 2 (two) times daily., Disp: 30 tablet, Rfl: 0   empagliflozin  (JARDIANCE ) 10 MG TABS tablet, Take 1 tablet (10 mg total) by mouth daily before breakfast., Disp: 90 tablet, Rfl: 3   esomeprazole (NEXIUM) 20 MG capsule, Take 20 mg by mouth daily at 6 (six) AM., Disp: , Rfl:    furosemide  (LASIX ) 20 MG tablet, TAKE 1 TABLET BY MOUTH DAILY AS  NEEDED FOR A WEIGHT OVER 200 LBS Pt needs to schedule appt with provider for further refills, Disp: 90 tablet, Rfl: 1   meloxicam  (MOBIC ) 15 MG tablet, Take 1 tablet (15 mg total) by mouth daily., Disp: 30 tablet, Rfl: 0   metoprolol  succinate (TOPROL  XL) 25 MG 24 hr tablet, Take 1 tablet (25 mg total) by mouth daily., Disp: 90 tablet, Rfl: 3   Multiple Vitamins-Minerals (MULTIVITAMIN WITH MINERALS) tablet, Take 1 tablet by mouth daily., Disp: , Rfl:    sildenafil  (REVATIO ) 20 MG tablet, Take 1 to 5 tablets as needed, Disp: 50 tablet, Rfl: 2   testosterone cypionate (DEPOTESTOSTERONE CYPIONATE) 200 MG/ML injection, Inject into the  muscle every 14 (fourteen) days., Disp: , Rfl:    triamcinolone  cream (KENALOG ) 0.1 %, Apply 1 Application topically 2 (two) times daily. For 2 weeks, then as needed, Disp: 45 g, Rfl: 0   Objective:     Vitals:   05/12/24 1018  BP: 112/84  Pulse: 73  SpO2: 98%  Weight: 204 lb (92.5 kg)  Height: 5' 11 (1.803 m)      Body mass index is 28.45 kg/m.    Physical Exam:    General: Appears well, no acute  distress, nontoxic and pleasant Neck: FROM, no pain Neuro: sensation is intact distally with no deficits, strenghth is 5/5 in elbow flexors/extenders/supinator/pronators and wrist flexors/extensors Psych: no evidence of anxiety or depression  Right elbow: No deformity, swelling or muscle wasting Normal Carrying angle ROM:0-140, supination and pronation 90 NTTP over triceps, ticeps tendon, olecranon, lat epicondyle, medial epicondyle, antecubital fossa, biceps tendon, supinator, pronator Negative tinnels over cubital tunnel No pain with resisted wrist and middle digit extension   pain with resisted wrist flexion No pain with resisted supination No pain with resisted pronation   valgus stress Negative varus stress Negative milking maneuver    Electronically signed by:  Odis Mace D[facility] 10:32 AM 05/12/24

## 2024-05-12 ENCOUNTER — Ambulatory Visit (INDEPENDENT_AMBULATORY_CARE_PROVIDER_SITE_OTHER): Admitting: Sports Medicine

## 2024-05-12 ENCOUNTER — Ambulatory Visit

## 2024-05-12 VITALS — BP 112/84 | HR 73 | Ht 71.0 in | Wt 204.0 lb

## 2024-05-12 DIAGNOSIS — M7701 Medial epicondylitis, right elbow: Secondary | ICD-10-CM

## 2024-05-12 DIAGNOSIS — M79631 Pain in right forearm: Secondary | ICD-10-CM

## 2024-05-12 MED ORDER — MELOXICAM 15 MG PO TABS: 15.0000 mg | ORAL_TABLET | Freq: Every day | ORAL | 0 refills | Status: DC

## 2024-05-12 NOTE — Patient Instructions (Addendum)
 Medial epicondylitis - Start meloxicam  15 mg daily x2 weeks.  If still having pain after 2 weeks, complete 3rd-week of NSAID. May use remaining NSAID as needed once daily for pain control.  Do not to use additional over-the-counter NSAIDs (ibuprofen, naproxen , Advil, Aleve , etc.) while taking prescription NSAIDs.  May use Tylenol  [phone] mg 2 to 3 times a day for breakthrough pain.\ Using wrist brace when active See me in 4 weeks

## 2024-05-12 NOTE — Addendum Note (Signed)
 Addended by: Lillie Bollig on: 05/12/2024 10:33 AM   Modules accepted: Level of Service

## 2024-05-15 ENCOUNTER — Ambulatory Visit: Attending: Cardiovascular Disease

## 2024-05-15 DIAGNOSIS — G459 Transient cerebral ischemic attack, unspecified: Secondary | ICD-10-CM

## 2024-05-16 ENCOUNTER — Ambulatory Visit: Payer: Self-pay | Admitting: Sports Medicine

## 2024-05-16 ENCOUNTER — Ambulatory Visit: Payer: Self-pay | Admitting: Cardiovascular Disease

## 2024-06-06 NOTE — Progress Notes (Signed)
 Ben Jamarkus Lisbon D.[facility] [address] [place] [address] Phone: [phone]   Assessment and Plan:     1. Right forearm pain (Primary) 2. Medial epicondylitis of elbow, right -Chronic with exacerbation, subsequent visit - Overall improvement in symptoms consistent with improving medial epicondylitis of right elbow, originally flared 7 months ago with heavy lifting and recurrent flares with fine motor movement of upper extremity - Continue HEP for golfers elbow - Continue using wrist brace when physically active to decrease strain over elbow - Use meloxicam  15 mg daily as needed for pain.  Recommend limiting chronic NSAIDs to 1-2 doses per week to prevent long-term side effects.     Pertinent previous records reviewed include none   Follow Up: As needed if no improvement or worsening of symptoms.  Could consider physical therapy versus prolonged bracing versus needling versus ECSWT versus nitrogen patch   Subjective:   I, Moenique Parris, am serving as a Neurosurgeon for Doctor Morene Mace   Chief Complaint: right arm pain    HPI:    05/12/2024 Patient is a 53 year old male with right arm pain. Patient states that 6 months ago he injured it moving logs. Pain over medial epicondyle and into his the flexors. Denies any pain with using keyboard or phone. Denies any numbness or tingling.   06/09/2024 Patient states he is getting better    Relevant Historical Information: None pertinent  Additional pertinent review of systems negative.   Current Outpatient Medications:    albuterol  (VENTOLIN  HFA) 108 (90 Base) MCG/ACT inhaler, Inhale 1-2 puffs into the lungs every 4 (four) hours as needed., Disp: 18 g, Rfl: 5   diclofenac  (VOLTAREN ) 75 MG EC tablet, Take 1 tablet (75 mg total) by mouth 2 (two) times daily., Disp: 30 tablet, Rfl: 0   empagliflozin  (JARDIANCE ) 10 MG TABS tablet, Take 1 tablet (10 mg total) by mouth daily before breakfast.,  Disp: 90 tablet, Rfl: 3   esomeprazole (NEXIUM) 20 MG capsule, Take 20 mg by mouth daily at 6 (six) AM., Disp: , Rfl:    furosemide  (LASIX ) 20 MG tablet, TAKE 1 TABLET BY MOUTH DAILY AS  NEEDED FOR A WEIGHT OVER 200 LBS Pt needs to schedule appt with provider for further refills, Disp: 90 tablet, Rfl: 1   meloxicam  (MOBIC ) 15 MG tablet, Take 1 tablet (15 mg total) by mouth daily as needed for pain., Disp: 30 tablet, Rfl: 0   metoprolol  succinate (TOPROL  XL) 25 MG 24 hr tablet, Take 1 tablet (25 mg total) by mouth daily., Disp: 90 tablet, Rfl: 3   Multiple Vitamins-Minerals (MULTIVITAMIN WITH MINERALS) tablet, Take 1 tablet by mouth daily., Disp: , Rfl:    sildenafil  (REVATIO ) 20 MG tablet, Take 1 to 5 tablets as needed, Disp: 50 tablet, Rfl: 2   testosterone cypionate (DEPOTESTOSTERONE CYPIONATE) 200 MG/ML injection, Inject into the muscle every 14 (fourteen) days., Disp: , Rfl:    triamcinolone  cream (KENALOG ) 0.1 %, Apply 1 Application topically 2 (two) times daily. For 2 weeks, then as needed, Disp: 45 g, Rfl: 0   Objective:     Vitals:   06/09/24 1420  Pulse: 71  SpO2: 96%  Weight: 209 lb (94.8 kg)  Height: 5' 11 (1.803 m)      Body mass index is 29.15 kg/m.    Physical Exam:    General: Appears well, no acute distress, nontoxic and pleasant Neck: FROM, no pain Neuro: sensation is intact distally with no  deficits, strenghth is 5/5 in elbow flexors/extenders/supinator/pronators and wrist flexors/extensors Psych: no evidence of anxiety or depression   Right elbow: No deformity, swelling or muscle wasting Normal Carrying angle ROM:0-140, supination and pronation 90 NTTP over triceps, ticeps tendon, olecranon, lat epicondyle, medial epicondyle, antecubital fossa, biceps tendon, supinator, pronator Negative tinnels over cubital tunnel No pain with resisted wrist and middle digit extension   pain with resisted wrist flexion No pain with resisted supination No pain with resisted  pronation   valgus stress Negative varus stress Negative milking maneuver     Electronically signed by:  Odis Mace [facility] 2:41 PM 06/09/24

## 2024-06-09 ENCOUNTER — Ambulatory Visit (INDEPENDENT_AMBULATORY_CARE_PROVIDER_SITE_OTHER): Admitting: Sports Medicine

## 2024-06-09 VITALS — HR 71 | Ht 71.0 in | Wt 209.0 lb

## 2024-06-09 DIAGNOSIS — M79631 Pain in right forearm: Secondary | ICD-10-CM | POA: Diagnosis not present

## 2024-06-09 DIAGNOSIS — M7701 Medial epicondylitis, right elbow: Secondary | ICD-10-CM

## 2024-06-09 MED ORDER — MELOXICAM 15 MG PO TABS: 15.0000 mg | ORAL_TABLET | Freq: Every day | ORAL | 0 refills | Status: DC | PRN

## 2024-06-09 NOTE — Patient Instructions (Signed)
-   Use meloxicam  15 mg daily as needed for pain.  Recommend limiting chronic NSAIDs to 1-2 doses per week to prevent long-term side effects.  Continue HEP   Continue bracing the wrist when physically active   As needed follow up

## 2024-06-21 ENCOUNTER — Other Ambulatory Visit: Payer: Self-pay | Admitting: Cardiovascular Disease

## 2024-07-01 ENCOUNTER — Other Ambulatory Visit: Payer: Self-pay | Admitting: Sports Medicine

## 2024-08-07 ENCOUNTER — Encounter: Payer: Self-pay | Admitting: Family Medicine

## 2024-08-07 ENCOUNTER — Ambulatory Visit: Payer: 59 | Admitting: Family Medicine

## 2024-08-07 VITALS — BP 130/88 | HR 72 | Temp 98.1°F | Resp 16 | Ht 71.0 in | Wt 214.0 lb

## 2024-08-07 DIAGNOSIS — Z6839 Body mass index (BMI) 39.0-39.9, adult: Secondary | ICD-10-CM

## 2024-08-07 DIAGNOSIS — Z800 Family history of malignant neoplasm of digestive organs: Secondary | ICD-10-CM | POA: Diagnosis not present

## 2024-08-07 DIAGNOSIS — E66812 Obesity, class 2: Secondary | ICD-10-CM

## 2024-08-07 DIAGNOSIS — K219 Gastro-esophageal reflux disease without esophagitis: Secondary | ICD-10-CM | POA: Diagnosis not present

## 2024-08-07 DIAGNOSIS — Z0001 Encounter for general adult medical examination with abnormal findings: Secondary | ICD-10-CM

## 2024-08-07 DIAGNOSIS — I5032 Chronic diastolic (congestive) heart failure: Secondary | ICD-10-CM | POA: Diagnosis not present

## 2024-08-07 DIAGNOSIS — I10 Essential (primary) hypertension: Secondary | ICD-10-CM | POA: Diagnosis not present

## 2024-08-07 DIAGNOSIS — H532 Diplopia: Secondary | ICD-10-CM

## 2024-08-07 DIAGNOSIS — Z23 Encounter for immunization: Secondary | ICD-10-CM

## 2024-08-07 DIAGNOSIS — Z9889 Other specified postprocedural states: Secondary | ICD-10-CM

## 2024-08-07 DIAGNOSIS — E291 Testicular hypofunction: Secondary | ICD-10-CM

## 2024-08-07 LAB — LIPID PANEL
Cholesterol: 159 mg/dL (ref 0–200)
HDL: 46.9 mg/dL (ref >39.00)
LDL Cholesterol: 93 mg/dL (ref 0–99)
NonHDL: 111.7
Total CHOL/HDL Ratio: 3
Triglycerides: 95 mg/dL (ref 0.0–149.0)
VLDL: 19 mg/dL (ref 0.0–40.0)

## 2024-08-07 LAB — COMPREHENSIVE METABOLIC PANEL WITH GFR
ALT: 30 U/L (ref 0–53)
AST: 21 U/L (ref 0–37)
Albumin: 4.4 g/dL (ref 3.5–5.2)
Alkaline Phosphatase: 30 U/L — ABNORMAL LOW (ref 39–117)
BUN: 13 mg/dL (ref 6–23)
CO2: 29 meq/L (ref 19–32)
Calcium: 9.3 mg/dL (ref 8.4–10.5)
Chloride: 104 meq/L (ref 96–112)
Creatinine, Ser: 1.04 mg/dL (ref 0.40–1.50)
GFR: 83.19 mL/min (ref >60.00)
Glucose, Bld: 85 mg/dL (ref 70–99)
Potassium: 4.6 meq/L (ref 3.5–5.1)
Sodium: 140 meq/L (ref 135–145)
Total Bilirubin: 0.6 mg/dL (ref 0.2–1.2)
Total Protein: 6.7 g/dL (ref 6.0–8.3)

## 2024-08-07 LAB — CBC WITH DIFFERENTIAL/PLATELET
Basophils Absolute: 0.1 K/uL (ref 0.0–0.1)
Basophils Relative: 1 % (ref 0.0–3.0)
Eosinophils Absolute: 0.3 K/uL (ref 0.0–0.7)
Eosinophils Relative: 5.8 % — ABNORMAL HIGH (ref 0.0–5.0)
HCT: 49.1 % (ref 39.0–52.0)
Hemoglobin: 16.3 g/dL (ref 13.0–17.0)
Lymphocytes Relative: 23.9 % (ref 12.0–46.0)
Lymphs Abs: 1.3 K/uL (ref 0.7–4.0)
MCHC: 33.2 g/dL (ref 30.0–36.0)
MCV: 85.5 fl (ref 78.0–100.0)
Monocytes Absolute: 0.6 K/uL (ref 0.1–1.0)
Monocytes Relative: 11.1 % (ref 3.0–12.0)
Neutro Abs: 3.1 K/uL (ref 1.4–7.7)
Neutrophils Relative %: 58.2 % (ref 43.0–77.0)
Platelets: 246 K/uL (ref 150.0–400.0)
RBC: 5.74 Mil/uL (ref 4.22–5.81)
RDW: 15.3 % (ref 11.5–15.5)
WBC: 5.4 K/uL (ref 4.0–10.5)

## 2024-08-07 LAB — TSH: TSH: 1.89 u[IU]/mL (ref 0.35–5.50)

## 2024-08-07 LAB — HEMOGLOBIN A1C: Hgb A1c MFr Bld: 6 % (ref 4.6–6.5)

## 2024-08-07 NOTE — Patient Instructions (Addendum)
 Please return in 12 months for your annual complete physical; please come fasting. For follow up on chronic medical conditions   I will release your lab results to you on your MyChart account with further instructions. You may see the results before I do, but when I review them I will send you a message with my report or have my assistant call you if things need to be discussed. Please reply to my message with any questions. Thank you!   If you have any questions or concerns, please don't hesitate to send me a message via MyChart or call the office at [phone]. Thank you for visiting with us  today! It's our pleasure caring for you.

## 2024-08-07 NOTE — Progress Notes (Signed)
 Subjective  Chief Complaint  Patient presents with   Annual Exam    Pt here for Annual Exam and is currently fasting . No questions or concerns at this time.    HPI: Frederick Martin is a 53 y.o. male who presents to [facility] [place] today for a Male Wellness Visit. He also has the concerns and/or needs as listed above in the chief complaint. These will be addressed in addition to the Health Maintenance Visit.   Wellness Visit: annual visit with health maintenance review and exam   HM: overdue for colonoscopy: surveillance due to + FH; [facility]. Will scheduled. Imms up to date. Feels well. Eye exam up to date. Mood good. Energy good. Weight is up. Flu shot today  Body mass index is 29.85 kg/m. Wt Readings from Last 3 Encounters:  08/07/24 214 lb (97.1 kg)  06/09/24 209 lb (94.8 kg)  05/12/24 204 lb (92.5 kg)   Chronic disease management visit and/or acute problem visit: Discussed the use of AI scribe software for clinical note transcription with the patient, who gave verbal consent to proceed.  History of Present Illness Frederick Martin is a 53 year old male who presents for a routine follow-up visit.  CHF and h/o MV repair: reviewed cardiology notes. Had recent echocardiogram showing stability. No new sxs. On multiple maintenance meds.  - Occasional shortness of breath with ambulation - Symptoms do not significantly impact daily activities - no cp or palpitations or edema or lightheadedness - on bb and jardiance   Neurological symptoms: diplopia, intermittent. Reviewed recent neuro notes.  - Evaluated by neurology over the summer for concerns of transient ischemic attacks (TIAs), described as 'mini strokes', has had brain MRI: neuro would do MRA if sxs worsen.  - echo was ok and cardiac monitor did not show afib - prevention strategies: control BP and lipids.  - Visual disturbances have become less frequent but are not completely resolved - on asa  81mg  daily  HTN on bb , low ldl naturally, no statin.  GERD no breakthrough on nexium. On testosterone for low T; doing well. Managed by specialist.   Constitutional and gastrointestinal symptoms - No abdominal pain - No blood in the stool - No bladder issues - No changes in energy - No mood changes    Assessment  1. Encounter for well adult exam with abnormal findings   2. Need for influenza vaccination   3. Binocular vision disorder with diplopia   4. CHF (congestive heart failure), NYHA class II, chronic, diastolic (HCC)   5. Essential hypertension   6. Family history of colon cancer in father   68. S/P MVR (mitral valve repair)   8. Male hypogonadism   9. Gastroesophageal reflux disease, unspecified whether esophagitis present   10. Class 2 severe obesity due to excess calories with serious comorbidity and body mass index (BMI) of 39.0 to 39.9 in adult      Plan  Male Wellness Visit: Age appropriate Health Maintenance and Prevention measures were discussed with patient. Included topics are cancer screening recommendations, ways to keep healthy (see AVS) including dietary and exercise recommendations, regular eye and dental care, use of seat belts, and avoidance of moderate alcohol use and tobacco use.  BMI: discussed patient's BMI and encouraged positive lifestyle modifications to help get to or maintain a target BMI. HM needs and immunizations were addressed and ordered. See below for orders. See HM and immunization section for updates. Routine labs and screening  tests ordered including cmp, cbc and lipids where appropriate. Discussed recommendations regarding Vit D and calcium supplementation (see AVS)  Chronic disease f/u and/or acute problem visit: (deemed necessary to be done in addition to the wellness visit): Assessment and Plan Assessment & Plan Adult Wellness Visit Routine adult wellness visit with no new concerns. Energy and mood are well-managed. No  gastrointestinal or urinary issues reported. - Scheduled colonoscopy due to family history, as last performed in 2020. - Ordered blood work. fasting - flu shot today  Chronic diastolic congestive heart failure: This medical condition is well controlled. There are no signs of complications, medication side effects, or red flags. Patient is instructed to continue the current treatment plan without change in therapies or medications.   HTN: controlled on bb. Could adjust dose up since diastolics running a little high. Will monitor. Rec low salt and weight loss  Continue testosterone. Thalia is controlled on nexium Diplopia (improving): unclear etiology.  Diplopia is improving with less frequent episodes. No new neurological findings from recent studies. Will monitor. Continue ASA  General Health Maintenance Due for colonoscopy as last performed in 2020. No reminders received from previous provider. - Scheduled colonoscopy. + FH    Follow up: 12 mo for cpe and f/u chronic problems.  Commons side effects, risks, benefits, and alternatives for medications and treatment plan prescribed today were discussed, and the patient expressed understanding of the given instructions. Patient is instructed to call or message via MyChart if he/she has any questions or concerns regarding our treatment plan. No barriers to understanding were identified. We discussed Red Flag symptoms and signs in detail. Patient expressed understanding regarding what to do in case of urgent or emergency type symptoms.  Medication list was reconciled, printed and provided to the patient in AVS. Patient instructions and summary information was reviewed with the patient as documented in the AVS. This note was prepared with assistance of Dragon voice recognition software. Occasional wrong-word or sound-a-like substitutions may have occurred due to the inherent limitations of voice recognition software  Orders Placed This Encounter   Procedures   Flu vaccine trivalent PF, 6mos and older(Flulaval,Afluria,Fluarix,Fluzone)   CBC with Differential/Platelet   Comprehensive metabolic panel with GFR   Lipid panel   Hemoglobin A1c   TSH   No orders of the defined types were placed in this encounter.    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   CHF (congestive heart failure), NYHA class II, chronic, diastolic (HCC) 01/26/2022   S/P MVR (mitral valve repair) 11/11/2021   Essential hypertension 09/27/2020   Family history of colon cancer in father 04/05/2018   Male hypogonadism 08/06/2019   Gastroesophageal reflux disease 03/07/2017   Mild intermittent asthma without complication 03/07/2017   Erectile dysfunction 06/28/2016   MVP (mitral valve prolapse) 03/21/2016   Allergic rhinitis 07/24/2011   Class 2 severe obesity due to excess calories with serious comorbidity and body mass index (BMI) of 39.0 to 39.9 in adult 08/07/2024   Health Maintenance  Topic Date Due   Colonoscopy  10/07/2024 (Originally 06/29/2024)   Hepatitis B Vaccines 19-59 Average Risk (1 of 3 - 19+ 3-dose series) 08/07/2025 (Originally [birth year]/1992)   DTaP/Tdap/Td (3 - Td or Tdap) 01/28/2032   Pneumococcal Vaccine: 50+ Years  Completed   Influenza Vaccine  Completed   COVID-19 Vaccine  Completed   Hepatitis C Screening  Completed   HIV Screening  Completed   Zoster Vaccines- Shingrix   Completed   HPV VACCINES  Aged Out   Meningococcal B Vaccine  Aged Out   Immunization History  Administered Date(s) Administered   Influenza Split 07/11/2013   Influenza, Quadrivalent, Recombinant, Inj, Pf 08/13/2016   Influenza, Seasonal, Injecte, Preservative Fre 08/07/2023, 08/07/2024   Influenza,inj,Quad PF,6+ Mos 11/09/2018, 06/28/2020, 08/03/2022   Influenza,inj,quad, With Preservative 07/28/2017   Influenza-Unspecified 09/14/2021   Moderna SARS-COV2 Booster Vaccination 10/15/2020    PFIZER(Purple Top)SARS-COV-2 Vaccination 12/07/2019, 01/07/2020   PNEUMOCOCCAL CONJUGATE-20 12/10/2023   Pfizer(Comirnaty)Fall Seasonal Vaccine 12 years and older 06/10/2024   Pneumococcal Polysaccharide-23 04/05/2018   Tdap 04/02/2011, 01/27/2022   Zoster Recombinant(Shingrix ) 08/07/2023, 12/10/2023   We updated and reviewed the patient's past history in detail and it is documented below. Allergies: Patient is allergic to losartan . Past Medical History  has a past medical history of Asthma, GERD (gastroesophageal reflux disease), Heart murmur, Hypertension, and MVP (mitral valve prolapse). Past Surgical History Patient  has a past surgical history that includes Wisdom tooth extraction (2001); RIGHT/LEFT HEART CATH AND CORONARY ANGIOGRAPHY (N/A, 09/08/2021); and TEE without cardioversion (N/A, 09/08/2021). Social History Patient  reports that he has never smoked. He has never used smokeless tobacco. He reports current alcohol use. He reports that he does not use drugs. Family History family history includes Cancer in his maternal grandmother; Colon cancer in his father and paternal grandfather; Depression in his father and mother; Diabetes in his father; Esophageal cancer in his maternal grandfather; Heart disease in his father; Hyperlipidemia in his father; Hypertension in his father. Review of Systems: Constitutional: negative for fever or malaise Ophthalmic: negative for photophobia, double vision or loss of vision Cardiovascular: negative for chest pain, dyspnea on exertion, or new LE swelling Respiratory: negative for SOB or persistent cough Gastrointestinal: negative for abdominal pain, change in bowel habits or melena Genitourinary: negative for dysuria or gross hematuria Musculoskeletal: negative for new gait disturbance or muscular weakness Integumentary: negative for new or persistent rashes Neurological: negative for TIA or stroke symptoms Psychiatric: negative for SI or  delusions Allergic/Immunologic: negative for hives  Patient Care Team    Relationship Specialty Notifications Start End  Jodie Lavern CROME, MD PCP - General Family Medicine  05/18/20   Croitoru, Jerel, MD PCP - Cardiology Cardiology  08/24/21   Eda Iha, MD (Inactive) Consulting Physician Gastroenterology  05/18/20   Janit Shores, MD Referring Physician Urology  05/18/20   Vear Charlie LABOR, MD Consulting Physician Neurology  03/07/23    Objective  Vitals: BP 130/88   Pulse 72   Temp 98.1 F (36.7 C) (Temporal)   Resp 16   Ht 5' 11 (1.803 m)   Wt 214 lb (97.1 kg)   SpO2 97%   BMI 29.85 kg/m  General:  Well developed, well nourished, no acute distress  Psych:  Alert and orientedx3,normal mood and affect HEENT:  Normocephalic, atraumatic, non-icteric sclera,  oropharynx is clear without mass or exudate, supple neck without adenopathy, or thyromegaly Cardiovascular:  Normal S1, S2, RRR without gallop, rub + murmur,  Respiratory:  Good breath sounds bilaterally, CTAB with normal respiratory effort Gastrointestinal: normal bowel sounds, soft, non-tender, no noted masses. No HSM MSK: Joints are without erythema or swelling.  Skin:  Warm, no rashes Neurologic:    Mental status is normal.  Gross motor and sensory exams are normal. Stable gait. No tremor

## 2024-08-12 ENCOUNTER — Encounter: Payer: Self-pay | Admitting: Family Medicine

## 2024-08-12 ENCOUNTER — Ambulatory Visit: Payer: Self-pay | Admitting: Family Medicine

## 2024-08-12 DIAGNOSIS — R7303 Prediabetes: Secondary | ICD-10-CM | POA: Insufficient documentation

## 2024-08-12 NOTE — Progress Notes (Signed)
 Labs reviewed. New prediabetes.  The 10-year ASCVD risk score (Arnett DK, et al., 2019) is: 3.6%   Values used to calculate the score:     Age: 53 years     Clincally relevant sex: Male     Is Non-Hispanic African American: No     Diabetic: No     Tobacco smoker: No     Systolic Blood Pressure: 130 mmHg     Is BP treated: Yes     HDL Cholesterol: 46.9 mg/dL     Total Cholesterol: 159 mg/dL  Dear Frederick Martin, Thank you for allowing me to care for you at your recent office visit.  I wanted to let you know that I have reviewed your lab test results and am happy to report that they are mostly stable.  However your diabetes screen shows you are prediabetic. Your cholesterol levels are ok. Please work on your diet lessening sugars/sweets and processed carbohydrates to avoid the sugars from worsening. Please schedule a visit in 6 months to recheck this level.   Sincerely, Dr. Jodie

## 2024-08-23 ENCOUNTER — Other Ambulatory Visit: Payer: Self-pay | Admitting: Sports Medicine

## 2024-08-23 ENCOUNTER — Other Ambulatory Visit: Payer: Self-pay | Admitting: Cardiovascular Disease

## 2024-08-26 MED ORDER — METOPROLOL SUCCINATE ER 25 MG PO TB24: 25.0000 mg | ORAL_TABLET | Freq: Every day | ORAL | 0 refills | Status: AC

## 2024-09-11 ENCOUNTER — Other Ambulatory Visit: Payer: Self-pay | Admitting: Cardiovascular Disease

## 2024-10-29 ENCOUNTER — Encounter: Payer: Self-pay | Admitting: Gastroenterology

## 2024-11-13 ENCOUNTER — Encounter

## 2024-11-27 ENCOUNTER — Encounter: Admitting: Gastroenterology
